# Patient Record
Sex: Female | Born: 1980 | Race: Asian | Hispanic: No | Marital: Married | Smoking: Never smoker
Health system: Southern US, Community
[De-identification: ages and names within clinical notes are randomized; demographics above are authoritative.]

## PROBLEM LIST (undated history)

## (undated) DIAGNOSIS — R12 Heartburn: Secondary | ICD-10-CM

## (undated) DIAGNOSIS — E785 Hyperlipidemia, unspecified: Secondary | ICD-10-CM

## (undated) DIAGNOSIS — O24419 Gestational diabetes mellitus in pregnancy, unspecified control: Secondary | ICD-10-CM

---

## 2006-07-02 HISTORY — PX: DIAGNOSTIC LAPAROSCOPY: SUR761

## 2007-02-12 ENCOUNTER — Encounter (INDEPENDENT_AMBULATORY_CARE_PROVIDER_SITE_OTHER): Payer: Self-pay | Admitting: *Deleted

## 2007-02-12 ENCOUNTER — Ambulatory Visit (HOSPITAL_COMMUNITY): Admission: RE | Admit: 2007-02-12 | Discharge: 2007-02-12 | Payer: Self-pay | Admitting: *Deleted

## 2007-05-18 ENCOUNTER — Inpatient Hospital Stay (HOSPITAL_COMMUNITY): Admission: AD | Admit: 2007-05-18 | Discharge: 2007-05-18 | Payer: Self-pay | Admitting: Obstetrics and Gynecology

## 2007-07-03 DIAGNOSIS — O24419 Gestational diabetes mellitus in pregnancy, unspecified control: Secondary | ICD-10-CM

## 2007-07-03 HISTORY — DX: Gestational diabetes mellitus in pregnancy, unspecified control: O24.419

## 2008-03-30 ENCOUNTER — Encounter: Admission: RE | Admit: 2008-03-30 | Discharge: 2008-03-30 | Payer: Self-pay | Admitting: Obstetrics and Gynecology

## 2008-06-21 ENCOUNTER — Inpatient Hospital Stay (HOSPITAL_COMMUNITY): Admission: AD | Admit: 2008-06-21 | Discharge: 2008-06-25 | Payer: Self-pay | Admitting: Obstetrics and Gynecology

## 2008-06-22 ENCOUNTER — Encounter (INDEPENDENT_AMBULATORY_CARE_PROVIDER_SITE_OTHER): Payer: Self-pay | Admitting: Obstetrics

## 2010-11-14 NOTE — H&P (Signed)
NAME:  Jessica Werner, ARSHAD                 ACCOUNT NO.:  [id number]   MEDICAL RECORD NO.:  [id number]          PATIENT TYPE:  MAT   LOCATION:  MATC                          F[facility]ILITY:  [facility]   PHYSICIAN:  Lenoard Aden, M.D.DATE OF BIRTH:  1981/04/06   DATE OF ADMISSION:  05/18/2007  DATE OF DISCHARGE:                              HISTORY & PHYSICAL   CHIEF COMPLAINT:  Bleeding.   HISTORY OF PRESENT ILLNESS:  This is a 30 year old African-American  female gravida 1, para 0, at 6 weeks LMP with bleeding today.   ALLERGIES:  PROMETRIUM.   SOCIAL HISTORY:  She is a nonsmoker.  She denies domestic or physical  violence.   PAST MEDICAL HISTORY:  She has a history of laparoscopy with removal of  dermoid cyst.  History of ASCUS Pap smear.   FAMILY HISTORY:  Remarkable for heart disease, hypertension, and  diabetes.   PHYSICAL EXAMINATION:  GENERAL:  She is a well-developed, well-  nourished, female in no acute distress.  VITAL SIGNS:  Weight 141 pounds.  HEENT:  Normal.  LUNGS:  Clear.  HEART:  Regular rate and rhythm.  ABDOMEN:  Soft and nontender.  PELVIC:  Cervix is closed.  Uterus slightly enlarged.  No adnexal  masses.  EXTREMITIES:  No cords.  NEUROLOGY:  Nonfocal.   Ultrasound reveals a 5-week intrauterine gestational sac, no fetal pole  seen.  Blood type is pending.   IMPRESSION:  Threatened abortion with intrauterine sac seen.   PLAN:  Follow up in the office as scheduled. Threatened abortion  warnings given.  Check blood type.  RhoGAM pending follow-up.      Lenoard Aden, M.D.  Electronically Signed     RJT/MEDQ  D:  05/18/2007  T:  05/19/2007  Job:  [id number]

## 2010-11-14 NOTE — Op Note (Signed)
NAME:  Jessica Werner, Jessica Werner                 ACCOUNT NO.:  [id number]   MEDICAL RECORD NO.:  [id number]          PATIENT TYPE:  AMB   LOCATION:  SDC                           [facility]   PHYSICIAN:  Poplar B. Earlene Plater, M.D.  DATE OF BIRTH:  [birth year]   DATE OF PROCEDURE:  02/12/2007  DATE OF DISCHARGE:                               OPERATIVE REPORT   PREOPERATIVE DIAGNOSIS:  6 cm complex right ovarian cyst.   POSTOPERATIVE DIAGNOSIS:  Endometrioma and dermoid cyst.   PROCEDURE:  Open laparoscopy and right ovarian cystectomy x2.   SURGEON:  Chester Holstein. Earlene Plater, M.D.   ASSISTANT:  Genia Del, M.D.   ANESTHESIA:  General.   FINDINGS:  4 cm endometrioma, 2 cm dermoid.   SPECIMENS:  Ovarian cyst to pathology.   BLOOD LOSS:  Minimal.   COMPLICATIONS:  None.   INDICATIONS:  Patient with history of complex right ovarian cyst  presents for removal.  Preop CA-125 was normal.  The appearance on  ultrasound was most consistent with endometrioma.  The patient advised  the risks of surgery, infection, bleeding, damage to surrounding organs,  potential need for laparotomy.   PROCEDURE:  Patient taken to the operating room.  General anesthesia  obtained.  She prepped and draped in standard fashion.  Foley catheter  inserted to the bladder.  Hulka tenaculum attached to anterior lip of  the cervix.   10 mm incision placed in the umbilicus and carried sharply to fascia.  Fascia was divided sharply and elevated with Kocher clamps.  Posterior  sheath and perineum were elevated with Allis and entered sharply.  Pursestring suture of 0 Vicryl placed around the fascial defect.  Hassan  cannula inserted and secured.  Pneumoperitoneum with CO2 gas, 11-mm XL  trocar placed in left lower quadrant, and 5 mm in the right each under  direct laparoscopic visualization.   Trendelenburg position obtained.  Bowel mobilized superiorly.  The  appendix and upper abdomen all appeared normal.  Pelvis  inspection  showed a right 6-cm ovarian cyst.  Left ovary, both tubes, uterus,  anterior, posterior cul-de-sac were all normal.  No evidence of  endometriosis in either place in the pelvis.   The right ovarian cyst was grasped, the capsule incised and undermined  and the cyst dissected away bluntly from the ovary.  Just toward the end  of dissection the capsule ruptured draining chocolate fluid consistent  with endometrioma.  The remainder of the cyst was removed.  Just  inferior to this additional cyst was noted.  This was removed intact.  The cysts were placed into the bag and pulled up to the left lower  quadrant port.  The fascia had to be extended slightly to allow the bag  to be removed.  The specimen had to be morcellated in the bag to allow  it to exit the port site.  The lower cyst turned out to be a dermoid  with hair and sebaceous material noted. No leakage into the abdomen.   Pelvis was irrigated, cyst bed was noted to be bleeding slightly.  Given  the large size  of the defect, I decided to close laparoscopically.  This was closed with interrupted figure-of-eight stitches of 0 Vicryl  with hemostasis obtained.   Pelvis irrigated.  Pneumoperitoneum taken down and line of dissection  inspected.  It was hemostatic.  Pneumoperitoneum reobtained and  intercede placed over the right ovary.   The left lower quadrant port was removed.  Pneumoperitoneum taken down.  Fascia closed with a figure-of-eight stitch of 0 Vicryl taking care to  avoid the underlying structures.  Pneumoperitoneum reobtained and the  port was well-approximated, inspected laparoscopically and was  hemostatic.  The right lower quadrant site was removed.  The site was  hemostatic.  The scope was removed.  Gas released.  Hassan cannula  removed.  Pneumoperitoneum released. The umbilical defect closed with  the previously placed pursestring suture after elevating the incision.  No intra-abdominal contents  herniated through prior closure.  Stapes  tissue was closed at the umbilicus and left lower quadrant with 4-0  Vicryl.  Skin was closed at each site with Dermabond.   The patient tolerated the procedure without complications.  She was  taken to recovery room awake, alert, and in stable condition.  All  counts correct per the operating staff.      Gerri Spore B. Earlene Plater, M.D.  Electronically Signed     WBD/MEDQ  D:  02/12/2007  T:  02/12/2007  Job:  [id number]   cc:   Tinnie Gens L. Elesa Hacker, M.D.  Fax: [phone]

## 2010-11-14 NOTE — H&P (Signed)
NAME:  Jessica Werner, Jessica Werner                 ACCOUNT NO.:  [id number]   MEDICAL RECORD NO.:  [id number]          PATIENT TYPE:  INP   LOCATION:  9167                          [facility]ILITY[facility]   PHYSICIAN:  Lenoard Aden, M.D.DATE OF BIRTH:  29-Aug-1980   DATE OF ADMISSION:  06/21/2008  DATE OF DISCHARGE:                              HISTORY & PHYSICAL   CHIEF COMPLAINT:  Induction of labor, gestational diabetes at 37 and 1/2  weeks.   She is a 30 year old [place] female G2, P0 with history of SAB x1 at 8  and 1/2 weeks' gestation with well-controlled gestational diabetes,  questionable LGA for cervical ripening induction.   She has allergies to [facility].   Medications are prenatal vitamins.   She has a personal history of ovarian cyst with nonsurgical  intervention.   She is a nonsmoker, nondrinker.  Denies domestic or physical violence.  History of SAB, complete without D&C.   Family history of myocardial infarction, hypertension, and diabetes.   Prenatal course complicated by gestational diabetes, well controlled on  no medications.   PHYSICAL EXAMINATION:  GENERAL:  On physical exam, she is a well-  developed, well-nourished [place] female in no acute distress.  HEENT:  Normal.  LUNGS:  Clear.  HEART:  Regular rhythm.  ABDOMEN:  Soft, gravid, nontender.  Estimated fetal weight 8 pounds.  GU:  Cervix is closed, 50%, vertex -2.  EXTREMITIES:  There are no cords.  NEUROLOGIC:  Nonfocal.  SKIN:  Intact.   IMPRESSION:  1. 39 and 1/2 weeks' intrauterine pregnancy.  2. Gestational diabetes, well controlled.   PLAN:  Proceed with induction of cervical plates.  NST reactive,  anticipate cautious attempts at vaginal delivery.      Lenoard Aden, M.D.  Electronically Signed     RJT/MEDQ  D:  06/21/2008  T:  06/22/2008  Job:  [id number]

## 2010-11-14 NOTE — Op Note (Signed)
NAME:  Jessica Werner, Jessica Werner                 ACCOUNT NO.:  [id number]   MEDICAL RECORD NO.:  [id number]          PATIENT TYPE:  INP   LOCATION:  9126                          [facility]   PHYSICIAN:  Lenoard Aden, M.D.DATE OF BIRTH:  [birth year]   DATE OF PROCEDURE:  DATE OF DISCHARGE:                               OPERATIVE REPORT   PREOPERATIVE DIAGNOSES:  1. Failure to descend.  2. Nonreassuring fetal heart rate tracing.  3. Gestational diabetes.   POSTOPERATIVE DIAGNOSES:  1. Failure to descend.  2. Nonreassuring fetal heart rate tracing.  3. Gestational diabetes.   PROCEDURE:  Primary low-segment transverse cesarean section.   SURGEON:  Lenoard Aden, MD   ANESTHESIA:  Epidural, Octaviano Glow. Pamalee Leyden, MD.   Bronwen Betters BLOOD LOSS:  1000 mL   COMPLICATIONS:  None.   DRAINS:  Foley.   COUNTS:  Correct.   SPECIMEN:  Placenta to Pathology.   DISPOSITION:  The patient to recovery in good condition.   FINDINGS:  Full-term living female, occiput posterior position, Apgars 5  and 8, cord pH 7.22, terminal meconium noted.  Two-layer uterine closure  performed.  Normal tubes.  Normal ovaries.   BRIEF OPERATIVE NOTE:  After being apprised of risks of anesthesia,  infection, bleeding, injury to abdominal organs, need for repair,  delayed versus immediate complications to include bowel and bladder  injury, the patient was brought to the operating room where she was  administered a dosing of epidural anesthetic without complications.  She  was prepped and draped in usual sterile fashion.  Foley catheter was  previously placed.  After achieving adequate anesthesia, dilute Marcaine  solution was placed.  Pfannenstiel skin incision was made with a  scalpel, carried down to fascia, which was nicked in the midline and  opened transversely using Mayo scissors.  Rectus muscles were dissected  sharply in the midline and peritoneum entered sharply.  Bladder blade  was placed.  Visceral  peritoneum scored sharply off the lower uterine  segment.  Kerr hysterotomy incision was made.  Atraumatic delivery of  full-term living female handed to pediatricians in attendance.  Apgars as  noted.  Cord pH collected.  Placenta delivered manually intact, three-  vessel cord, uterus curetted using a dry lap pack and closed in 2  running imbricating layers of 0 Monocryl suture.  Good hemostasis was  noted.  Interrupted suture placed in the midline for hemostasis.  Bladder flap inspected and found be hemostatic.  Irrigation  accomplished.  Fascia then closed using a 0-Monocryl in continuous  running fashion.  Skin was closed using staples.  Subcutaneous tissue  was reapproximated using 0 plain in a continuous running fashion.  Pressure dressing placed.  The patient tolerated the procedure well and  was transferred to recovery room in good condition.      Lenoard Aden, M.D.  Electronically Signed     RJT/MEDQ  D:  06/22/2008  T:  06/23/2008  Job:  [id number]

## 2010-11-17 NOTE — Discharge Summary (Signed)
NAME:  Jessica Werner, ARCOS                 ACCOUNT NO.:  [id number]   MEDICAL RECORD NO.:  [id number]            PATIENT TYPE:   LOCATION:                                 FACILITY:   PHYSICIAN:  Lenoard Aden, M.D.     DATE OF BIRTH:   DATE OF ADMISSION:  06/21/2008  DATE OF DISCHARGE:  06/25/2008                               DISCHARGE SUMMARY   HOSPITAL COURSE:  The patient underwent an uncomplicated primary low  segment transverse Cesarean section on June 22, 2008.  Her  postoperative course was uncomplicated.  She tolerated her diet well.  Discharge teaching done.  Discharge medications to include Tylox,  prenatal vitamins and iron.  She is to follow up in the office in 4 to 6  weeks.      Lenoard Aden, M.D.  Electronically Signed     RJT/MEDQ  D:  08/24/2008  T:  08/24/2008  Job:  [id number]

## 2011-04-06 LAB — CBC
HCT: 37.5 % (ref 36.0–46.0)
HCT: 37.9 % (ref 36.0–46.0)
HCT: 41.5 % (ref 36.0–46.0)
Hemoglobin: 11.5 g/dL — ABNORMAL LOW (ref 12.0–15.0)
Hemoglobin: 13.1 g/dL (ref 12.0–15.0)
Hemoglobin: 14.5 g/dL (ref 12.0–15.0)
MCHC: 34.9 g/dL (ref 30.0–36.0)
MCV: 89.5 fL (ref 78.0–100.0)
Platelets: 80 10*3/uL — ABNORMAL LOW (ref 150–400)
RBC: 4.2 MIL/uL (ref 3.87–5.11)
RBC: 4.24 MIL/uL (ref 3.87–5.11)
RDW: 14.7 % (ref 11.5–15.5)
RDW: 14.7 % (ref 11.5–15.5)
RDW: 15.2 % (ref 11.5–15.5)
WBC: 10.8 10*3/uL — ABNORMAL HIGH (ref 4.0–10.5)
WBC: 13.6 10*3/uL — ABNORMAL HIGH (ref 4.0–10.5)
WBC: 9.1 10*3/uL (ref 4.0–10.5)

## 2011-04-06 LAB — GLUCOSE, CAPILLARY
Glucose-Capillary: 73 mg/dL (ref 70–99)
Glucose-Capillary: 76 mg/dL (ref 70–99)

## 2011-04-06 LAB — COMPREHENSIVE METABOLIC PANEL
ALT: 14 U/L (ref 0–35)
Alkaline Phosphatase: 234 U/L — ABNORMAL HIGH (ref 39–117)
BUN: 4 mg/dL — ABNORMAL LOW (ref 6–23)
Calcium: 9.3 mg/dL (ref 8.4–10.5)
Total Bilirubin: 0.5 mg/dL (ref 0.3–1.2)

## 2011-04-10 LAB — ABO/RH: ABO/RH(D): B POS

## 2011-04-16 LAB — DIFFERENTIAL
Basophils Absolute: 0
Basophils Relative: 1
Eosinophils Absolute: 0.1
Eosinophils Relative: 1
Lymphs Abs: 3
Monocytes Relative: 5
Neutrophils Relative %: 62

## 2011-04-16 LAB — CBC
RDW: 13.1
WBC: 9.6

## 2011-04-16 LAB — PREGNANCY, URINE: Preg Test, Ur: NEGATIVE

## 2013-04-17 ENCOUNTER — Ambulatory Visit: Payer: BC Managed Care – PPO

## 2013-04-17 ENCOUNTER — Ambulatory Visit: Payer: BC Managed Care – PPO | Admitting: Emergency Medicine

## 2013-04-17 VITALS — BP 110/60 | HR 77 | Temp 97.8°F | Resp 16 | Ht <= 58 in | Wt 138.0 lb

## 2013-04-17 DIAGNOSIS — M542 Cervicalgia: Secondary | ICD-10-CM

## 2013-04-17 DIAGNOSIS — M549 Dorsalgia, unspecified: Secondary | ICD-10-CM

## 2013-04-17 MED ORDER — CYCLOBENZAPRINE HCL 5 MG PO TABS: ORAL_TABLET | ORAL | Status: DC

## 2013-04-17 MED ORDER — MELOXICAM 7.5 MG PO TABS: ORAL_TABLET | ORAL | Status: DC

## 2013-04-17 NOTE — Progress Notes (Signed)
This chart was scribed for Earl Lites MD by Joaquin Music, ED Scribe. This patient was seen in room Room 2 and the patient's care was started at 12:22 PM  Subjective:    Patient ID: Jessica Werner, female    DOB: 29-Nov-1980, 32 y.o.   MRN: [id number]  HPI Jessica Werner is a 32 y.o. female who presents to the [facility] complaining of ongoing, worsening upper back pain for over 2 weeks. She states the pain has been "coming and going." Pt states the pain is below her neck. Pt made modifications with her pillows in order to get sleep. She has recently started her [employer] camp, which she has been doing prior to pain being present. Pt denies numbness in extremities. She has taken OTC Ibuprofen. Pt has applied heat and cold pack to the area. Pt is a Surveyor, minerals. Pt denies pregnancy. Pt states she has irregular menstrual cycles.   Pt is originally from [place].    Review of Systems     Objective:   Physical Exam  Constitutional: She appears well-developed and well-nourished.  Musculoskeletal:  Tender over C4-C5 processes. DTR 2+ except for L Triceps which was absent due to previous injury. Motor strength 5/5.  Psychiatric: She has a normal mood and affect. Her behavior is normal.   [facility] reading (PRIMARY) by  Dr Cleta Alberts she has C6-C7 degenerative disc disease. Chest x-ray is normal.       Assessment & Plan:  We'll treat with anti-inflammatory muscle relaxant and cut back on her exercise program.

## 2013-04-17 NOTE — Patient Instructions (Signed)

## 2013-05-07 ENCOUNTER — Other Ambulatory Visit: Payer: Self-pay

## 2014-06-18 ENCOUNTER — Other Ambulatory Visit: Payer: Self-pay | Admitting: Family Medicine

## 2014-06-18 DIAGNOSIS — R1904 Left lower quadrant abdominal swelling, mass and lump: Secondary | ICD-10-CM

## 2014-06-22 ENCOUNTER — Ambulatory Visit (INDEPENDENT_AMBULATORY_CARE_PROVIDER_SITE_OTHER): Payer: BC Managed Care – PPO

## 2014-06-22 DIAGNOSIS — R1904 Left lower quadrant abdominal swelling, mass and lump: Secondary | ICD-10-CM

## 2014-08-05 ENCOUNTER — Other Ambulatory Visit: Payer: Self-pay | Admitting: Obstetrics and Gynecology

## 2014-08-12 ENCOUNTER — Encounter (HOSPITAL_COMMUNITY)
Admission: RE | Admit: 2014-08-12 | Discharge: 2014-08-12 | Disposition: A | Discharge status: Home / Self Care | Payer: BLUE CROSS/BLUE SHIELD | Source: Ambulatory Visit | Attending: Obstetrics and Gynecology | Admitting: Obstetrics and Gynecology

## 2014-08-12 ENCOUNTER — Encounter (HOSPITAL_COMMUNITY): Payer: Self-pay

## 2014-08-12 DIAGNOSIS — Z01818 Encounter for other preprocedural examination: Secondary | ICD-10-CM | POA: Insufficient documentation

## 2014-08-12 DIAGNOSIS — D369 Benign neoplasm, unspecified site: Secondary | ICD-10-CM | POA: Insufficient documentation

## 2014-08-12 HISTORY — DX: Heartburn: R12

## 2014-08-12 HISTORY — DX: Hyperlipidemia, unspecified: E78.5

## 2014-08-12 LAB — CBC
HCT: 38.6 % (ref 36.0–46.0)
HEMOGLOBIN: 13.2 g/dL (ref 12.0–15.0)
MCH: 28.9 pg (ref 26.0–34.0)
MCHC: 34.2 g/dL (ref 30.0–36.0)
MCV: 84.6 fL (ref 78.0–100.0)
Platelets: 224 10*3/uL (ref 150–400)
RBC: 4.56 MIL/uL (ref 3.87–5.11)
RDW: 12.6 % (ref 11.5–15.5)
WBC: 7.7 10*3/uL (ref 4.0–10.5)

## 2014-08-12 NOTE — Patient Instructions (Addendum)
   Your procedure is scheduled on: Thursday, Feb 18  Enter through the [employer] [facility] at: 8:15 AM Pick up the phone at the desk and dial [phone] and inform us of your arrival.  Please call this number if you have any problems the morning of surgery: [phone]  Remember: Do not eat or drink after midnight: Wednesday Take these medicines the morning of surgery with a SIP OF WATER: NONE  Do not wear jewelry, make-up, or FINGER nail polish No metal in your hair or on your body. Do not wear lotions, powders, perfumes.  You may wear deodorant.  Do not bring valuables to the hospital. Contacts, dentures or bridgework may not be worn into surgery.    Patients discharged on the day of surgery will not be allowed to drive home.  Home with husband Edrick Kins  cell [phone]

## 2014-08-18 NOTE — H&P (Signed)
NAME:  Jessica Werner, RUNKEL                 ACCOUNT NO.:  [id number]  MEDICAL RECORD NO.:  [id number]  LOCATION:  PERIO                         FACILITY:  [place]  PHYSICIAN:  Lovenia Kim, M.D.DATE OF BIRTH:  1981/01/22  DATE OF ADMISSION:  07/19/2014 DATE OF DISCHARGE:                             HISTORY & PHYSICAL   PREOPERATIVE DIAGNOSIS:  Complex left ovarian mass consistent with dermoid with negative tumor markers.  HISTORY OF PRESENT ILLNESS:  The patient is a 35 year old female G2, P1 who presents for surgical evaluation of left ovarian cyst with left lower quadrant pain noted.  ALLERGIES:  She has allergies to [facility].  MEDICATIONS:  Fenofibrate.  FAMILY HISTORY:  Heart disease, diabetes, and chronic hypertension.  PAST MEDICAL HISTORY:  She has a history of a miscarriage x1 and C- section x1.  Previous laparoscopy in 2008 with removal of a right ovarian cyst consistent with a dermoid and an endometrioma at that time and uncomplicated at that time.  PHYSICAL EXAMINATION:  GENERAL:  She is a well-developed, well-nourished female, in no acute distress. HEENT:  Normal. NECK:  Supple.  Full range of motion. LUNGS:  Clear. HEART:  Regular rate and rhythm. ABDOMEN:  Soft, nontender.  Pelvic exam reveals left adnexal tenderness and fullness. EXTREMITIES:  There are no cords. NEUROLOGIC:  Nonfocal. SKIN:  Intact.  IMPRESSION:  Left lower quadrant pain with complex left ovarian cyst most consistent with left dermoid cyst.  Tumor markers negative.  PLAN:  Proceed with da Vinci assisted left ovarian cystectomy, possible left S and O.  Risks of anesthesia, infection, bleeding, injury to surrounding organs with possible need for repair was discussed, delayed versus immediate complications to include bowel and bladder injury noted.     Lovenia Kim, M.D.     RJT/MEDQ  D:  08/18/2014  T:  08/18/2014  Job:  [id number]  cc:   [facility]

## 2014-08-18 NOTE — Anesthesia Preprocedure Evaluation (Signed)
Anesthesia Evaluation  Patient identified by MRN, date of birth, ID band Patient awake    Reviewed: Allergy & Precautions, NPO status , Patient's Chart, lab work & pertinent test results  History of Anesthesia Complications Negative for: history of anesthetic complications  Airway Mallampati: II  TM Distance: >3 FB Neck ROM: Full    Dental no notable dental hx. (+) Dental Advisory Given   Pulmonary neg pulmonary ROS,  breath sounds clear to auscultation  Pulmonary exam normal       Cardiovascular negative cardio ROS  Rhythm:Regular Rate:Normal     Neuro/Psych negative neurological ROS  negative psych ROS   GI/Hepatic Neg liver ROS, GERD-  Medicated and Controlled,  Endo/Other  negative endocrine ROS  Renal/GU negative Renal ROS  negative genitourinary   Musculoskeletal negative musculoskeletal ROS (+)   Abdominal   Peds negative pediatric ROS (+)  Hematology negative hematology ROS (+)   Anesthesia Other Findings   Reproductive/Obstetrics negative OB ROS                             Anesthesia Physical Anesthesia Plan  ASA: II  Anesthesia Plan: General   Post-op Pain Management:    Induction: Intravenous  Airway Management Planned: Oral ETT  Additional Equipment:   Intra-op Plan:   Post-operative Plan: Extubation in OR  Informed Consent: I have reviewed the patients History and Physical, chart, labs and discussed the procedure including the risks, benefits and alternatives for the proposed anesthesia with the patient or authorized representative who has indicated his/her understanding and acceptance.   Dental advisory given  Plan Discussed with: CRNA  Anesthesia Plan Comments:         Anesthesia Quick Evaluation

## 2014-08-19 ENCOUNTER — Ambulatory Visit (HOSPITAL_COMMUNITY): Payer: BLUE CROSS/BLUE SHIELD | Admitting: Anesthesiology

## 2014-08-19 ENCOUNTER — Encounter (HOSPITAL_COMMUNITY): Payer: Self-pay | Admitting: Certified Registered Nurse Anesthetist

## 2014-08-19 ENCOUNTER — Encounter (HOSPITAL_COMMUNITY)
Admission: RE | Disposition: A | Discharge status: Home / Self Care | Payer: Self-pay | Source: Ambulatory Visit | Attending: Obstetrics and Gynecology

## 2014-08-19 ENCOUNTER — Ambulatory Visit (HOSPITAL_COMMUNITY)
Admission: RE | Admit: 2014-08-19 | Discharge: 2014-08-19 | Disposition: A | Discharge status: Home / Self Care | Payer: BLUE CROSS/BLUE SHIELD | Source: Ambulatory Visit | Attending: Obstetrics and Gynecology | Admitting: Obstetrics and Gynecology

## 2014-08-19 DIAGNOSIS — N8320 Unspecified ovarian cysts: Secondary | ICD-10-CM | POA: Diagnosis not present

## 2014-08-19 DIAGNOSIS — Z888 Allergy status to other drugs, medicaments and biological substances status: Secondary | ICD-10-CM | POA: Insufficient documentation

## 2014-08-19 DIAGNOSIS — N736 Female pelvic peritoneal adhesions (postinfective): Secondary | ICD-10-CM | POA: Insufficient documentation

## 2014-08-19 DIAGNOSIS — N801 Endometriosis of ovary: Secondary | ICD-10-CM | POA: Diagnosis not present

## 2014-08-19 HISTORY — PX: ROBOTIC ASSISTED LAPAROSCOPIC OVARIAN CYSTECTOMY: SHX6081

## 2014-08-19 LAB — HCG, SERUM, QUALITATIVE: Preg, Serum: NEGATIVE

## 2014-08-19 SURGERY — ROBOTIC ASSISTED LAPAROSCOPIC OVARIAN CYSTECTOMY
Anesthesia: General | Site: Abdomen | Laterality: Left

## 2014-08-19 MED ORDER — PROPOFOL 10 MG/ML IV BOLUS
INTRAVENOUS | Status: AC
  Filled 2014-08-19: qty 20

## 2014-08-19 MED ORDER — ARTIFICIAL TEARS OP OINT
TOPICAL_OINTMENT | OPHTHALMIC | Status: AC
  Filled 2014-08-19: qty 3.5

## 2014-08-19 MED ORDER — HYDROMORPHONE HCL 1 MG/ML IJ SOLN
INTRAMUSCULAR | Status: AC
  Filled 2014-08-19: qty 1

## 2014-08-19 MED ORDER — OXYCODONE-ACETAMINOPHEN 5-325 MG PO TABS
ORAL_TABLET | ORAL | Status: AC
  Administered 2014-08-19: 1 via ORAL
  Filled 2014-08-19: qty 1

## 2014-08-19 MED ORDER — NEOSTIGMINE METHYLSULFATE 10 MG/10ML IV SOLN
INTRAVENOUS | Status: DC | PRN
  Administered 2014-08-19: 3 mg via INTRAVENOUS

## 2014-08-19 MED ORDER — FENTANYL CITRATE 0.05 MG/ML IJ SOLN
INTRAMUSCULAR | Status: DC | PRN
  Administered 2014-08-19 (×4): 50 ug via INTRAVENOUS

## 2014-08-19 MED ORDER — SCOPOLAMINE 1 MG/3DAYS TD PT72
MEDICATED_PATCH | TRANSDERMAL | Status: AC
  Administered 2014-08-19: 1.5 mg via TRANSDERMAL
  Filled 2014-08-19: qty 1

## 2014-08-19 MED ORDER — FENTANYL CITRATE 0.05 MG/ML IJ SOLN
INTRAMUSCULAR | Status: AC
  Filled 2014-08-19: qty 5

## 2014-08-19 MED ORDER — PROPOFOL 10 MG/ML IV BOLUS
INTRAVENOUS | Status: DC | PRN
  Administered 2014-08-19: 200 mg via INTRAVENOUS

## 2014-08-19 MED ORDER — DEXAMETHASONE SODIUM PHOSPHATE 10 MG/ML IJ SOLN
INTRAMUSCULAR | Status: DC | PRN
  Administered 2014-08-19: 4 mg via INTRAVENOUS

## 2014-08-19 MED ORDER — ROPIVACAINE HCL 5 MG/ML IJ SOLN
INTRAMUSCULAR | Status: AC
  Filled 2014-08-19: qty 30

## 2014-08-19 MED ORDER — CEFAZOLIN SODIUM-DEXTROSE 2-3 GM-% IV SOLR
2.0000 g | INTRAVENOUS | Status: AC
  Administered 2014-08-19: 2 g via INTRAVENOUS

## 2014-08-19 MED ORDER — ROCURONIUM BROMIDE 100 MG/10ML IV SOLN
INTRAVENOUS | Status: DC | PRN
  Administered 2014-08-19: 40 mg via INTRAVENOUS
  Administered 2014-08-19: 10 mg via INTRAVENOUS

## 2014-08-19 MED ORDER — KETOROLAC TROMETHAMINE 30 MG/ML IJ SOLN
INTRAMUSCULAR | Status: AC
Start: 2014-08-19 — End: 2014-08-19
  Filled 2014-08-19: qty 1

## 2014-08-19 MED ORDER — MIDAZOLAM HCL 2 MG/2ML IJ SOLN
INTRAMUSCULAR | Status: AC
Start: 2014-08-19 — End: 2014-08-19
  Filled 2014-08-19: qty 2

## 2014-08-19 MED ORDER — GLYCOPYRROLATE 0.2 MG/ML IJ SOLN
INTRAMUSCULAR | Status: AC
  Filled 2014-08-19: qty 3

## 2014-08-19 MED ORDER — BUPIVACAINE HCL (PF) 0.25 % IJ SOLN
INTRAMUSCULAR | Status: AC
  Filled 2014-08-19: qty 30

## 2014-08-19 MED ORDER — SCOPOLAMINE 1 MG/3DAYS TD PT72
1.0000 | MEDICATED_PATCH | Freq: Once | TRANSDERMAL | Status: DC
  Administered 2014-08-19: 1.5 mg via TRANSDERMAL

## 2014-08-19 MED ORDER — DEXAMETHASONE SODIUM PHOSPHATE 4 MG/ML IJ SOLN
INTRAMUSCULAR | Status: AC
  Filled 2014-08-19: qty 1

## 2014-08-19 MED ORDER — ONDANSETRON HCL 4 MG/2ML IJ SOLN: 4.0000 mg | Freq: Once | INTRAMUSCULAR | Status: DC | PRN

## 2014-08-19 MED ORDER — ONDANSETRON HCL 4 MG/2ML IJ SOLN
INTRAMUSCULAR | Status: AC
  Filled 2014-08-19: qty 2

## 2014-08-19 MED ORDER — BUPIVACAINE HCL (PF) 0.25 % IJ SOLN
INTRAMUSCULAR | Status: AC
  Filled 2014-08-19: qty 20

## 2014-08-19 MED ORDER — SODIUM CHLORIDE 0.9 % IJ SOLN
INTRAMUSCULAR | Status: DC | PRN
  Administered 2014-08-19: 20 mL via INTRAVENOUS

## 2014-08-19 MED ORDER — LACTATED RINGERS IR SOLN
Status: DC | PRN
  Administered 2014-08-19: 3000 mL

## 2014-08-19 MED ORDER — BUPIVACAINE LIPOSOME 1.3 % IJ SUSP
20.0000 mL | Freq: Once | INTRAMUSCULAR | Status: AC
  Administered 2014-08-19: 20 mL
  Filled 2014-08-19: qty 20

## 2014-08-19 MED ORDER — SODIUM CHLORIDE 0.9 % IJ SOLN
INTRAMUSCULAR | Status: AC
  Filled 2014-08-19: qty 50

## 2014-08-19 MED ORDER — LIDOCAINE HCL (CARDIAC) 20 MG/ML IV SOLN
INTRAVENOUS | Status: DC | PRN
  Administered 2014-08-19: 30 mg via INTRAVENOUS

## 2014-08-19 MED ORDER — MIDAZOLAM HCL 2 MG/2ML IJ SOLN
INTRAMUSCULAR | Status: DC | PRN
  Administered 2014-08-19: 2 mg via INTRAVENOUS

## 2014-08-19 MED ORDER — OXYCODONE-ACETAMINOPHEN 5-325 MG PO TABS
1.0000 | ORAL_TABLET | ORAL | Status: DC | PRN
  Administered 2014-08-19: 1 via ORAL

## 2014-08-19 MED ORDER — FENTANYL CITRATE 0.05 MG/ML IJ SOLN: 25.0000 ug | INTRAMUSCULAR | Status: DC | PRN

## 2014-08-19 MED ORDER — ONDANSETRON HCL 4 MG/2ML IJ SOLN: INTRAMUSCULAR | Status: DC | PRN

## 2014-08-19 MED ORDER — SODIUM CHLORIDE 0.9 % IJ SOLN
INTRAMUSCULAR | Status: AC
  Filled 2014-08-19: qty 10

## 2014-08-19 MED ORDER — CEFAZOLIN SODIUM-DEXTROSE 2-3 GM-% IV SOLR
INTRAVENOUS | Status: AC
  Filled 2014-08-19: qty 50

## 2014-08-19 MED ORDER — ONDANSETRON HCL 4 MG/2ML IJ SOLN
INTRAMUSCULAR | Status: DC | PRN
  Administered 2014-08-19: 4 mg via INTRAVENOUS

## 2014-08-19 MED ORDER — KETOROLAC TROMETHAMINE 30 MG/ML IJ SOLN
INTRAMUSCULAR | Status: DC | PRN
  Administered 2014-08-19: 30 mg via INTRAVENOUS

## 2014-08-19 MED ORDER — NEOSTIGMINE METHYLSULFATE 10 MG/10ML IV SOLN
INTRAVENOUS | Status: AC
  Filled 2014-08-19: qty 1

## 2014-08-19 MED ORDER — LACTATED RINGERS IV SOLN
INTRAVENOUS | Status: DC
  Administered 2014-08-19: 09:00:00 via INTRAVENOUS

## 2014-08-19 MED ORDER — LIDOCAINE HCL (PF) 1 % IJ SOLN
INTRAMUSCULAR | Status: AC
  Filled 2014-08-19: qty 5

## 2014-08-19 MED ORDER — GLYCOPYRROLATE 0.2 MG/ML IJ SOLN
INTRAMUSCULAR | Status: DC | PRN
  Administered 2014-08-19: 0.4 mg via INTRAVENOUS

## 2014-08-19 MED ORDER — OXYCODONE-ACETAMINOPHEN 5-325 MG PO TABS: 1.0000 | ORAL_TABLET | ORAL | Status: DC | PRN

## 2014-08-19 SURGICAL SUPPLY — 60 items
BARRIER ADHS 3X4 INTERCEED (GAUZE/BANDAGES/DRESSINGS) ×3 IMPLANT
CLOTH BEACON ORANGE TIMEOUT ST (SAFETY) ×3 IMPLANT
CONT PATH 16OZ SNAP LID 3702 (MISCELLANEOUS) ×3 IMPLANT
COVER BACK TABLE 60X90IN (DRAPES) ×6 IMPLANT
COVER TIP SHEARS 8 DVNC (MISCELLANEOUS) ×2 IMPLANT
COVER TIP SHEARS 8MM DA VINCI (MISCELLANEOUS) ×4
DECANTER SPIKE VIAL GLASS SM (MISCELLANEOUS) ×3 IMPLANT
DRAPE WARM FLUID 44X44 (DRAPE) ×3 IMPLANT
DRSG COVADERM PLUS 2X2 (GAUZE/BANDAGES/DRESSINGS) ×12 IMPLANT
DRSG OPSITE POSTOP 3X4 (GAUZE/BANDAGES/DRESSINGS) ×3 IMPLANT
ELECT REM PT RETURN 9FT ADLT (ELECTROSURGICAL) ×3
ELECTRODE REM PT RTRN 9FT ADLT (ELECTROSURGICAL) ×1 IMPLANT
GAUZE VASELINE 3X9 (GAUZE/BANDAGES/DRESSINGS) IMPLANT
GLOVE BIO SURGEON STRL SZ7.5 (GLOVE) ×3 IMPLANT
GYRUS RUMI II 2.5CM BLUE (DISPOSABLE)
GYRUS RUMI II 3.5CM BLUE (DISPOSABLE)
GYRUS RUMI II 4.0CM BLUE (DISPOSABLE)
KIT ACCESSORY DA VINCI DISP (KITS) ×2
KIT ACCESSORY DVNC DISP (KITS) ×1 IMPLANT
LIQUID BAND (GAUZE/BANDAGES/DRESSINGS) ×3 IMPLANT
NEEDLE HYPO 22GX1.5 SAFETY (NEEDLE) ×3 IMPLANT
NEEDLE INSUFFLATION 150MM (ENDOMECHANICALS) ×3 IMPLANT
NS IRRIG 1000ML POUR BTL (IV SOLUTION) ×9 IMPLANT
OCCLUDER COLPOPNEUMO (BALLOONS) IMPLANT
PACK ROBOT WH (CUSTOM PROCEDURE TRAY) ×3 IMPLANT
PACK ROBOTIC GOWN (GOWN DISPOSABLE) ×3 IMPLANT
PAD POSITIONER PINK NONSTERILE (MISCELLANEOUS) ×3 IMPLANT
PAD PREP 24X48 CUFFED NSTRL (MISCELLANEOUS) ×6 IMPLANT
RUMI II 3.0CM BLUE KOH-EFFICIE (DISPOSABLE) IMPLANT
RUMI II GYRUS 2.5CM BLUE (DISPOSABLE) IMPLANT
RUMI II GYRUS 3.5CM BLUE (DISPOSABLE) IMPLANT
RUMI II GYRUS 4.0CM BLUE (DISPOSABLE) IMPLANT
SET CYSTO W/LG BORE CLAMP LF (SET/KITS/TRAYS/PACK) IMPLANT
SET IRRIG TUBING LAPAROSCOPIC (IRRIGATION / IRRIGATOR) ×3 IMPLANT
SET TRI-LUMEN FLTR TB AIRSEAL (TUBING) IMPLANT
SUT VIC AB 0 CT1 27 (SUTURE) ×2
SUT VIC AB 0 CT1 27XBRD ANBCTR (SUTURE) ×1 IMPLANT
SUT VIC AB 3-0 SH 27 (SUTURE) ×4
SUT VIC AB 3-0 SH 27X BRD (SUTURE) ×2 IMPLANT
SUT VICRYL 0 UR6 27IN ABS (SUTURE) ×6 IMPLANT
SUT VICRYL 4-0 PS2 18IN ABS (SUTURE) ×3 IMPLANT
SUT VICRYL RAPIDE 4/0 PS 2 (SUTURE) IMPLANT
SUT VLOC 180 3-0 9IN GS21 (SUTURE) IMPLANT
SYR 50ML LL SCALE MARK (SYRINGE) ×6 IMPLANT
SYR CONTROL 10ML LL (SYRINGE) ×3 IMPLANT
SYRINGE 10CC LL (SYRINGE) ×3 IMPLANT
SYSTEM CONVERTIBLE TROCAR (TROCAR) IMPLANT
TIP UTERINE 5.1X6CM LAV DISP (MISCELLANEOUS) IMPLANT
TIP UTERINE 6.7X10CM GRN DISP (MISCELLANEOUS) ×3 IMPLANT
TIP UTERINE 6.7X6CM WHT DISP (MISCELLANEOUS) IMPLANT
TIP UTERINE 6.7X8CM BLUE DISP (MISCELLANEOUS) IMPLANT
TOWEL OR 17X24 6PK STRL BLUE (TOWEL DISPOSABLE) ×9 IMPLANT
TRAY FOLEY BAG SILVER LF 16FR (CATHETERS) ×3 IMPLANT
TROCAR DISP BLADELESS 8 DVNC (TROCAR) ×1 IMPLANT
TROCAR DISP BLADELESS 8MM (TROCAR) ×2
TROCAR PORT AIRSEAL 5X120 (TROCAR) IMPLANT
TROCAR XCEL 12X100 BLDLESS (ENDOMECHANICALS) IMPLANT
TROCAR XCEL NON-BLD 5MMX100MML (ENDOMECHANICALS) ×3 IMPLANT
TROCAR Z-THREAD 12X150 (TROCAR) ×3 IMPLANT
WARMER LAPAROSCOPE (MISCELLANEOUS) ×3 IMPLANT

## 2014-08-19 NOTE — Transfer of Care (Signed)
Immediate Anesthesia Transfer of Care Note  Patient: Jessica Werner  Procedure(s) Performed: Procedure(s): ROBOTIC ASSISTED LAPAROSCOPIC OVARIAN CYSTECTOMY (Left)  Patient Location: PACU  Anesthesia Type:General  Level of Consciousness: awake and sedated  Airway & Oxygen Therapy: Patient Spontanous Breathing and Patient connected to nasal cannula oxygen  Post-op Assessment: Report given to RN and Post -op Vital signs reviewed and stable  Post vital signs: Reviewed and stable  Last Vitals:  Filed Vitals:   08/19/14 0825  BP: 118/73  Pulse: 79  Temp: 36.9 C  Resp: 18    Complications: No apparent anesthesia complications

## 2014-08-19 NOTE — Discharge Instructions (Signed)

## 2014-08-19 NOTE — Anesthesia Postprocedure Evaluation (Signed)
  Anesthesia Post-op Note  Patient: Jessica Werner  Procedure(s) Performed: Procedure(s): ROBOTIC ASSISTED LAPAROSCOPIC OVARIAN CYSTECTOMY (Left)  Patient Location: PACU  Anesthesia Type:General  Level of Consciousness: awake, alert  and oriented  Airway and Oxygen Therapy: Patient Spontanous Breathing  Post-op Pain: mild  Post-op Assessment: Post-op Vital signs reviewed, Patient's Cardiovascular Status Stable, Respiratory Function Stable, Patent Airway, No signs of Nausea or vomiting and Pain level controlled  Post-op Vital Signs: Reviewed and stable  Last Vitals:  Filed Vitals:   08/19/14 1345  BP: 114/87  Pulse: 72  Temp:   Resp: 17    Complications: No apparent anesthesia complications

## 2014-08-19 NOTE — Op Note (Signed)
08/19/2014  12:37 PM  PATIENT:  Jessica Werner  34 y.o. female  PRE-OPERATIVE DIAGNOSIS:  Dermoid Cyst  POST-OPERATIVE DIAGNOSIS:  Dermoid Cyst of left ovary Right ovarian endometriosis Omental adhesions Left ovarian simple cyst  PROCEDURE:  Procedure(s): ROBOTIC ASSISTED LAPAROSCOPIC OVARIAN CYSTECTOMY LYSIS OF ADHESIONS ABLATION AND EXCISION OF LEFT OVARIAN ENDOMETRIOSIS LEFT OVARIAN CYSTOTOMY  SURGEON:  Surgeon(s): Lovenia Kim, MD  ASSISTANTS: Drake Leach, cNM   ANESTHESIA:   local and general  ESTIMATED BLOOD LOSS: MINIMAL   DRAINS: Urinary Catheter (Foley)   LOCAL MEDICATIONS USED:  MARCAINE    and Amount: 40 ml  SPECIMEN:  Source of Specimen:  LEFT OVARIAN CYST CAPSULE AND  CONTENTS  DISPOSITION OF SPECIMEN:  PATHOLOGY  COUNTS:  YES  DICTATION #: [id number]  PLAN OF CARE: DC HOME  PATIENT DISPOSITION:  PACU - hemodynamically stable.

## 2014-08-19 NOTE — Progress Notes (Signed)
Patient ID: Jessica Werner, female   DOB: [birth year], 34 y.o.   MRN: [id number] Patient seen and examined. Consent witnessed and signed. No changes noted. Update completed.

## 2014-08-20 ENCOUNTER — Encounter (HOSPITAL_COMMUNITY): Payer: Self-pay | Admitting: Obstetrics and Gynecology

## 2014-08-20 NOTE — Op Note (Signed)
NAME:  Jessica Werner, Jessica Werner                 ACCOUNT NO.:  [id number]  MEDICAL RECORD NO.:  [id number]  LOCATION:                                 FACILITY:  PHYSICIAN:  Lovenia Kim, M.D.DATE OF BIRTH:  [birth year]  DATE OF PROCEDURE:  08/19/2014 DATE OF DISCHARGE:                              OPERATIVE REPORT   PREOPERATIVE DIAGNOSIS:  Left lower quadrant pain and left complex ovarian mass consistent with dermoid cyst.  POSTOPERATIVE DIAGNOSES: 1. Left lower quadrant pain and left complex ovarian mass consistent     with dermoid cyst. 2. Right ovarian endometriosis, simple left ovarian cyst, and pelvic     adhesions.  PROCEDURE:  Da Vinci assisted left ovarian cystectomy, ablation and excision of right ovarian endometriosis, lysis of omental adhesions in the left lower and right lower quadrant, and left ovarian cystotomy.  SURGEON:  Lovenia Kim, M.D.  Terrence DupontDrake Leach.  ESTIMATED BLOOD LOSS:  Less than 50 mL.  COMPLICATIONS:  None.  DRAINS:  Foley.  COUNTS:  Correct.  DISPOSITION:  The patient went to recovery room in good condition.  BRIEF OPERATIVE NOTE:  After being apprised of risks of anesthesia, infection, bleeding, injury to surrounding organs, possible need for repair, delayed versus immediate complications to include bowel and bladder injury, possible need for repair, the patient was brought to the operating room, where she was administered general anesthetic without complications.  Prepped and draped in usual sterile fashion.  Foley catheter was placed.  A RUMI retractor was placed per vagina. Infraumbilical incision was made with a scalpel.  Veress needle placed. Opening pressure -1, 3 L CO2 insufflated without difficulty.  Trocar was placed atraumatically.  Visualization reveals as noted,  right ovarian endometriosis, large left ovarian cyst, which appears to be a dual cyst with a simple component, omental adhesions in the left lower quadrant and the  right lower quadrant.  At this time, the 2 auxiliary ports were placed on the left and an assistant port and an 8 mm port and one 8 mm port on the right.  Through the 8 mm port on the right, the camera is entered to lyse sharply with EndoShears, the omental adhesions from the anterior abdominal wall, so that the pelvis can  be better visualized.  At this time, robot was then docked in a standard fashion placing the EndoShears and PK forceps.  A linear incision is made over the cystic mass from the left ovary, and the ovarian capsule was seen and the cyst was then excised intact and placed in the cul-de-sac.  Good hemostasis was noted.  The left ovarian cystotomy was performed for aspiration of clear fluid.  On the right ovary, there is monopolar ablation and excision of right ovarian endometriosis without difficulty.  Both tubes appear normal.  Anterior cul-de-sac with some minor adhesive disease from previous C-section, posterior cul-de-sac is clear.  At this time, the robot was undocked and the 8 mm port was placed.  The dermoid cyst or ovarian mass was placed into an EndoCatch and brought up through the abdominal incision, where it is ruptured and enucleated without spilling any contents into the abdomen.  At this  time, good hemostasis was noted. Revisualization reveals a hemostatic left ovary, which was then wrapped in Interceed.  After achieving good hemostasis, all instruments were removed under direct visualization.  CO2 was released per the AirSeal. The incisions were closed using 0 Vicryl, 4-0 Vicryl, and Dermabond. The RUMI was removed from the vagina.  The patient tolerated the procedure well, was awakened, and transferred to recovery in good condition.     Lovenia Kim, M.D.     RJT/MEDQ  D:  08/19/2014  T:  08/20/2014  Job:  [id number]

## 2014-12-11 ENCOUNTER — Emergency Department (HOSPITAL_COMMUNITY)
Admission: EM | Admit: 2014-12-11 | Discharge: 2014-12-12 | Disposition: A | Discharge status: Home / Self Care | Payer: BLUE CROSS/BLUE SHIELD | Attending: Emergency Medicine | Admitting: Emergency Medicine

## 2014-12-11 ENCOUNTER — Encounter (HOSPITAL_COMMUNITY): Payer: Self-pay | Admitting: Emergency Medicine

## 2014-12-11 DIAGNOSIS — Z3202 Encounter for pregnancy test, result negative: Secondary | ICD-10-CM | POA: Diagnosis not present

## 2014-12-11 DIAGNOSIS — Z8639 Personal history of other endocrine, nutritional and metabolic disease: Secondary | ICD-10-CM | POA: Diagnosis not present

## 2014-12-11 DIAGNOSIS — R55 Syncope and collapse: Secondary | ICD-10-CM | POA: Insufficient documentation

## 2014-12-11 NOTE — ED Provider Notes (Signed)
CSN: [id number]     Arrival date & time 12/11/14  2324 History   First MD Initiated Contact with Patient 12/11/14 2344     Chief Complaint  Patient presents with  . Near Syncope     (Consider location/radiation/quality/duration/timing/severity/associated sxs/prior Treatment) HPI   34 year old female brought here via EMS for evaluation of "an allergy attack". Patient and husband went to a movie tonight. Afterward but went to chief complaint restaurant and ate a salad bowl which they have eaten the past. While driving home patient developed a tingling sensation around her lips. A few minutes later, patient felt lightheadedness and had a near syncopal episode. She felt her fingers went stiff, having difficulty holding objects and also having chills. Husband immediately call EMS. Once they arrive, patient appears to be hyperventilating, complaining of tingling sensation around mouth but felt that her lips or improving. Patient is unsure if this is an allergic reaction. She denies any recent soap detergent changes, new pets, or new medication. She denies having anxiety or having panic attack. No complaint of throat swelling, difficulty breathing, having chest pain or shortness of breath. She denies abdominal cramping nausea vomiting diarrhea. She did recall 3 separate episodes of syncope after giving blood in the past.  She does not have any food or drugs allergies. Normal stress level.  Past Medical History  Diagnosis Date  . Hyperlipidemia   . Heartburn     occasional   Past Surgical History  Procedure Laterality Date  . Diagnostic laparoscopy  2008    RIGHT OVARION CYST  . Cesarean section  2009    [facility]  . Robotic assisted laparoscopic ovarian cystectomy Left 08/19/2014    Procedure: ROBOTIC ASSISTED LAPAROSCOPIC OVARIAN CYSTECTOMY;  Surgeon: Lovenia Kim, MD;  Location: [facility];  Service: Gynecology;  Laterality: Left;   Family History  Problem Relation Age of Onset  . Hypertension  Mother    History  Substance Use Topics  . Smoking status: Never Smoker   . Smokeless tobacco: Never Used  . Alcohol Use: No   OB History    No data available     Review of Systems  All other systems reviewed and are negative.     Allergies  Review of patient's allergies indicates no known allergies.  Home Medications   Prior to Admission medications   Medication Sig Start Date End Date Taking? Authorizing Provider  Alpha-D-Galactosidase (BEANO PO) Take 1 tablet by mouth daily as needed (For gas.).    Historical Provider, MD  cyclobenzaprine (FLEXERIL) 5 MG tablet Take one tablet at night as needed for muscle relaxant the Patient not taking: Reported on 08/03/2014 04/17/13   Darlyne Russian, MD  fenofibrate (TRICOR) 145 MG tablet Take 145 mg by mouth at bedtime.     Historical Provider, MD  meloxicam (MOBIC) 7.5 MG tablet Take 1-2 tablets daily as an anti-inflammatory Patient not taking: Reported on 08/03/2014 04/17/13   Darlyne Russian, MD  oxyCODONE-acetaminophen (ROXICET) 5-325 MG per tablet Take 1-2 tablets by mouth every 4 (four) hours as needed for severe pain. 08/19/14   Brien Few, MD  ranitidine (ZANTAC) 150 MG tablet Take 150 mg by mouth daily as needed for heartburn.    Historical Provider, MD   BP 123/83 mmHg  Pulse 92  Temp(Src) 98 F (36.7 C)  Resp 20  SpO2 100% Physical Exam  Constitutional: She is oriented to person, place, and time. She appears well-developed and well-nourished. No distress.  HENT:  Head: Atraumatic.  Mouth/Throat: Oropharynx is clear and moist.  Lips are normal, no tongue edema, no trismus, no obvious airway compromise.  Eyes: Conjunctivae are normal.  Neck: Normal range of motion. Neck supple. No tracheal deviation present.  Cardiovascular: Normal rate and regular rhythm.   Pulmonary/Chest: Effort normal and breath sounds normal. No respiratory distress. She has no wheezes. She exhibits no tenderness.  Abdominal: Soft. Bowel sounds are  normal. She exhibits no distension. There is no tenderness.  Musculoskeletal: She exhibits tenderness. She exhibits no edema.  Neurological: She is alert and oriented to person, place, and time.  Skin: No rash noted.  Psychiatric: She has a normal mood and affect.  Nursing note and vitals reviewed.   ED Course  Procedures (including critical care time)  Patient presents for a near syncopal episode and symptoms suggestive of a possible panic attack without specific triggers. No lips edema and no obvious evidence to suggest anaphylaxis. Will obtain EKG, and orthostatic vital signs. Will give IV fluid, Benadryl, and Pepcid. Plan to monitor however anticipate discharge.  12:30 AM Normal orthostatic vital sign. ECG without arrhythmia or acute ischemic changes.   1:00 AM Patient currently receiving IV fluid. Her vital signs stable. Anticipate discharge with follow-up with PCP for further valuation.  Care discussed with oncoming provider who will d/c pt pending istat chem8 and reassessment.    Labs Review Labs Reviewed  I-STAT CHEM 8, ED - Abnormal; Notable for the following:    Glucose, Bld 114 (*)    All other components within normal limits  I-STAT BETA HCG BLOOD, ED ([facility], [facility], [facility] ONLY)    Imaging Review No results found.   EKG Interpretation None      Date: 12/12/2014  Rate: 79  Rhythm: normal sinus rhythm  QRS Axis: normal  Intervals: normal  ST/T Wave abnormalities: normal  Conduction Disutrbances: none  Narrative Interpretation:   Old EKG Reviewed: No significant changes noted     MDM   Final diagnoses:  Near syncope    BP 123/83 mmHg  Pulse 92  Temp(Src) 98 F (36.7 C)  Resp 20  SpO2 100%     Domenic Moras, PA-C 12/12/14 1455  Julianne Rice, MD 12/21/14 [phone]

## 2014-12-11 NOTE — ED Notes (Signed)
Brought in by EMS from [employer] store with c/o allergic reaction.  Pt called EMS because she felt she was having "an allergy attack"---- she felt that her upper lip "is breaking out".  No s/s swelling or s/s skin issues noted by EMS on arrival; but on their arrival at the scene, pt reported that she has had a near syncopal episode.  Pt reported that she was hyperventilating--- "having a panic attack because of having an allergic reaction".   Pt c/o "weakness" to EMS--- pt reported that she has not been drinking enough fluids today---- she feels dehydrated.

## 2014-12-11 NOTE — ED Notes (Signed)
Bed: WA01 Expected date:  Expected time:  Means of arrival:  Comments: EMS 34yo allergic reaction Jessica Werner

## 2014-12-12 LAB — I-STAT CHEM 8, ED
BUN: 6 mg/dL (ref 6–20)
Calcium, Ion: 1.18 mmol/L (ref 1.12–1.23)
Chloride: 106 mmol/L (ref 101–111)
Creatinine, Ser: 0.7 mg/dL (ref 0.44–1.00)
Glucose, Bld: 114 mg/dL — ABNORMAL HIGH (ref 65–99)
HCT: 36 % (ref 36.0–46.0)
Hemoglobin: 12.2 g/dL (ref 12.0–15.0)
Potassium: 3.9 mmol/L (ref 3.5–5.1)
Sodium: 142 mmol/L (ref 135–145)
TCO2: 22 mmol/L (ref 0–100)

## 2014-12-12 LAB — I-STAT BETA HCG BLOOD, ED (MC, WL, AP ONLY)

## 2014-12-12 MED ORDER — FAMOTIDINE 20 MG PO TABS
20.0000 mg | ORAL_TABLET | Freq: Once | ORAL | Status: AC
  Administered 2014-12-12: 20 mg via ORAL
  Filled 2014-12-12: qty 1

## 2014-12-12 MED ORDER — DIPHENHYDRAMINE HCL 50 MG/ML IJ SOLN
25.0000 mg | Freq: Once | INTRAMUSCULAR | Status: AC
  Administered 2014-12-12: 25 mg via INTRAVENOUS
  Filled 2014-12-12: qty 1

## 2014-12-12 MED ORDER — SODIUM CHLORIDE 0.9 % IV BOLUS (SEPSIS)
1000.0000 mL | Freq: Once | INTRAVENOUS | Status: AC
  Administered 2014-12-12: 1000 mL via INTRAVENOUS

## 2014-12-12 NOTE — Discharge Instructions (Signed)
Near-Syncope Near-syncope (commonly known as near fainting) is sudden weakness, dizziness, or feeling like you might pass out. During an episode of near-syncope, you may also develop pale skin, have tunnel vision, or feel sick to your stomach (nauseous). Near-syncope may occur when getting up after sitting or while standing for a long time. It is caused by a sudden decrease in blood flow to the brain. This decrease can result from various causes or triggers, most of which are not serious. However, because near-syncope can sometimes be a sign of something serious, a medical evaluation is required. The specific cause is often not determined. HOME CARE INSTRUCTIONS  Monitor your condition for any changes. The following actions may help to alleviate any discomfort you are experiencing:  Have someone stay with you until you feel stable.  Lie down right away and prop your feet up if you start feeling like you might faint. Breathe deeply and steadily. Wait until all the symptoms have passed. Most of these episodes last only a few minutes. You may feel tired for several hours.   Drink enough fluids to keep your urine clear or pale yellow.   If you are taking blood pressure or heart medicine, get up slowly when seated or lying down. Take several minutes to sit and then stand. This can reduce dizziness.  Follow up with your health care provider as directed. SEEK IMMEDIATE MEDICAL CARE IF:   You have a severe headache.   You have unusual pain in the chest, abdomen, or back.   You are bleeding from the mouth or rectum, or you have black or tarry stool.   You have an irregular or very fast heartbeat.   You have repeated fainting or have seizure-like jerking during an episode.   You faint when sitting or lying down.   You have confusion.   You have difficulty walking.   You have severe weakness.   You have vision problems.  MAKE SURE YOU:   Understand these instructions.  Will  watch your condition.  Will get help right away if you are not doing well or get worse. Document Released: 06/18/2005 Document Revised: 06/23/2013 Document Reviewed: 11/21/2012 ExitCare Patient Information 2015 [employer]. This information is not intended to replace advice given to you by your health care provider. Make sure you discuss any questions you have with your health care provider.  

## 2014-12-12 NOTE — ED Provider Notes (Signed)
0245 - Patient care assumed from Jessica Moras, Jessica Werner at shift change. Patient presenting to the ED for evaluation of lip swelling and "tingling" as well as b/l hand contractures. Symptoms associated with near syncope, but no true syncopal event. Patient hydrated with IVF. During my assessment she states that she feels better, overall. She is able to ambulate in the hall and to the bathroom without difficulty. Labs reviewed. No electrolyte abnormalities. Anion gap is normal. No anemia. HCG is negative.  Symptoms thought to be secondary to dehydration vs anxiety vs allergic reaction. Plan discussed with Jessica Moras, Jessica Werner which  Includes discharge if laboratory workup is reassuring. No indication for further emergent workup at this time. Will discharge with instruction of follow-up with her primary care doctor on Monday. Return precautions discussed and provided. Patient agreeable to plan with no unaddressed concerns. Patient discharged in good condition.   Filed Vitals:   12/11/14 2328 12/12/14 0225  BP: 123/83 104/59  Pulse: 92 80  Temp: 98 F (36.7 C)   Resp: 20 18  SpO2: 100% 99%     Antonietta Breach, Jessica Werner 12/12/14 0256  Julianne Rice, MD 12/12/14 [phone]

## 2015-03-29 ENCOUNTER — Other Ambulatory Visit: Payer: Self-pay | Admitting: Gastroenterology

## 2015-03-29 DIAGNOSIS — R0789 Other chest pain: Secondary | ICD-10-CM

## 2015-04-11 ENCOUNTER — Encounter (HOSPITAL_COMMUNITY)
Admission: RE | Admit: 2015-04-11 | Discharge: 2015-04-11 | Disposition: A | Discharge status: Home / Self Care | Payer: BLUE CROSS/BLUE SHIELD | Source: Ambulatory Visit | Attending: Gastroenterology | Admitting: Gastroenterology

## 2015-04-11 ENCOUNTER — Ambulatory Visit (HOSPITAL_COMMUNITY)
Admission: RE | Admit: 2015-04-11 | Discharge: 2015-04-11 | Disposition: A | Discharge status: Home / Self Care | Payer: BLUE CROSS/BLUE SHIELD | Source: Ambulatory Visit | Attending: Gastroenterology | Admitting: Gastroenterology

## 2015-04-11 DIAGNOSIS — R932 Abnormal findings on diagnostic imaging of liver and biliary tract: Secondary | ICD-10-CM | POA: Insufficient documentation

## 2015-04-11 DIAGNOSIS — R079 Chest pain, unspecified: Secondary | ICD-10-CM | POA: Insufficient documentation

## 2015-04-11 DIAGNOSIS — E785 Hyperlipidemia, unspecified: Secondary | ICD-10-CM | POA: Insufficient documentation

## 2015-04-11 DIAGNOSIS — R0789 Other chest pain: Secondary | ICD-10-CM

## 2015-04-11 DIAGNOSIS — R109 Unspecified abdominal pain: Secondary | ICD-10-CM | POA: Insufficient documentation

## 2015-04-11 MED ORDER — SINCALIDE 5 MCG IJ SOLR
0.0200 ug/kg | Freq: Once | INTRAMUSCULAR | Status: AC
Start: 2015-04-11 — End: 2015-04-11
  Administered 2015-04-11: 1.19 ug via INTRAVENOUS

## 2015-04-11 MED ORDER — TECHNETIUM TC 99M MEBROFENIN IV KIT
5.3000 | PACK | Freq: Once | INTRAVENOUS | Status: DC | PRN
  Administered 2015-04-11: 5 via INTRAVENOUS
  Filled 2015-04-11: qty 6

## 2015-04-11 MED ORDER — SINCALIDE 5 MCG IJ SOLR
INTRAMUSCULAR | Status: AC
  Filled 2015-04-11: qty 5

## 2015-12-07 IMAGING — US US ABDOMEN COMPLETE
1 series · 13 of 25 positions shown · non-contrast
Comparison: None.

CLINICAL DATA: Postprandial chest and abdominal pain. History of
hyperlipidemia.

EXAM:
ULTRASOUND ABDOMEN COMPLETE

[Series 1: us abdomen complete · 0.20mm/px · 13 of 93 slices shown]
[im 1/93]
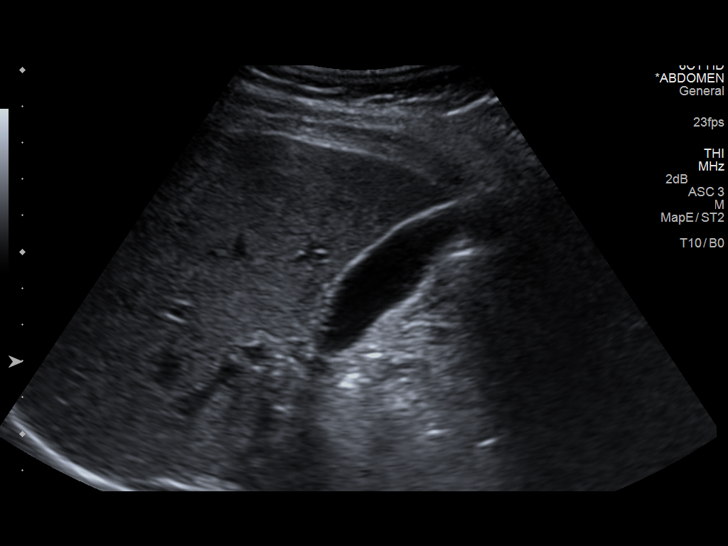
[im 8/93]
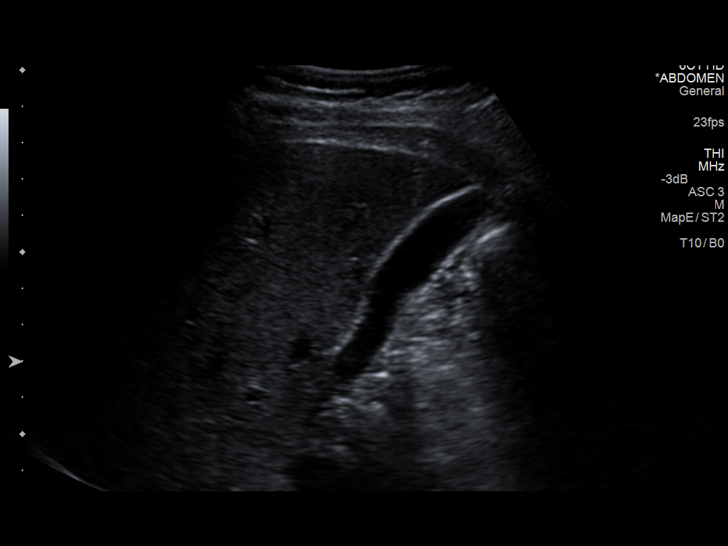
[im 16/93]
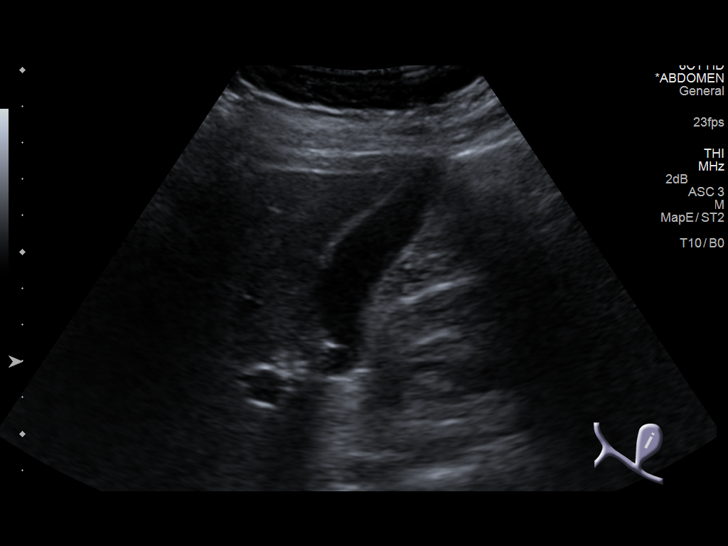
[im 24/93]
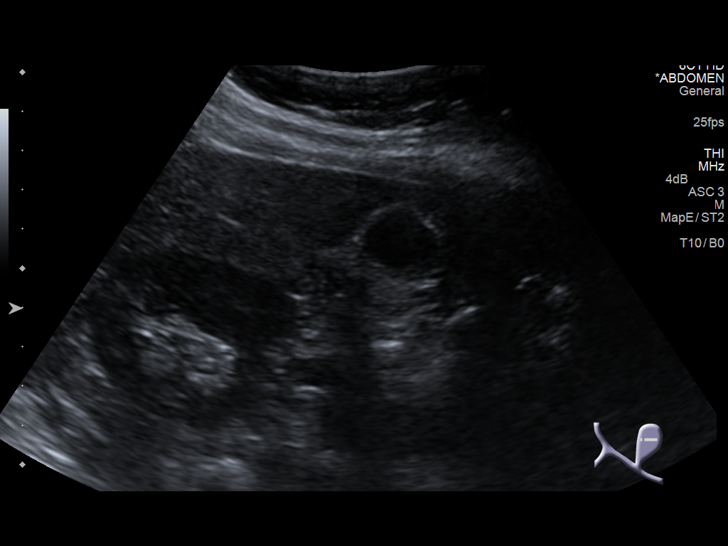
[im 31/93]
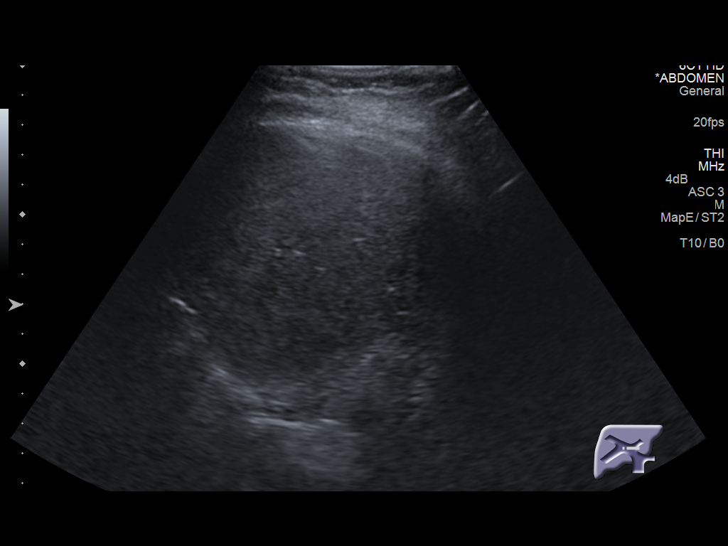
[im 39/93]
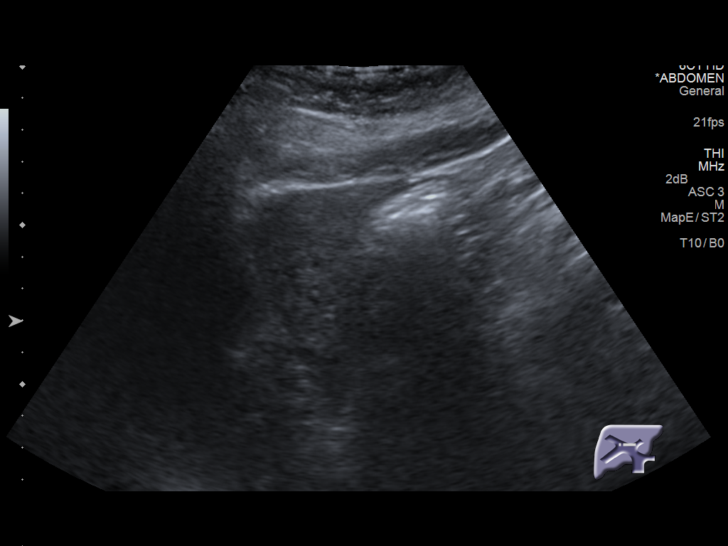
[im 47/93]
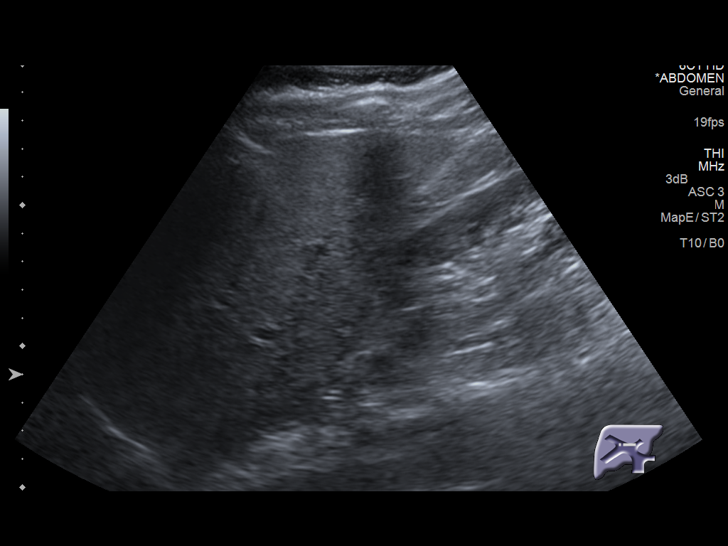
[im 54/93]
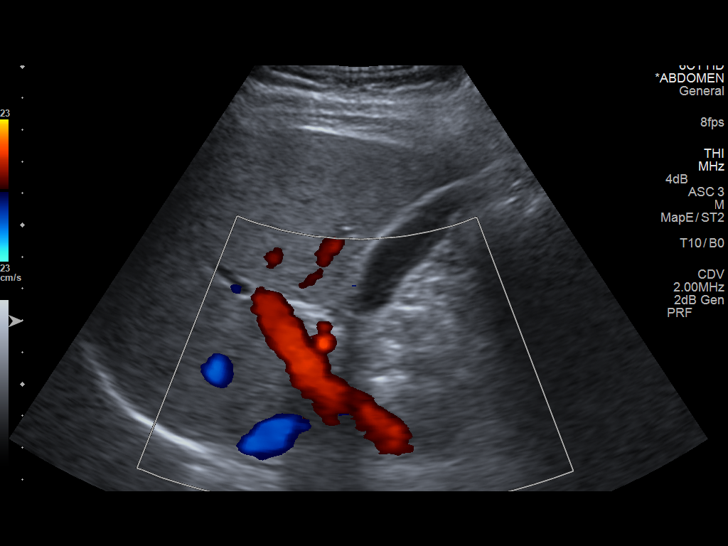
[im 62/93]
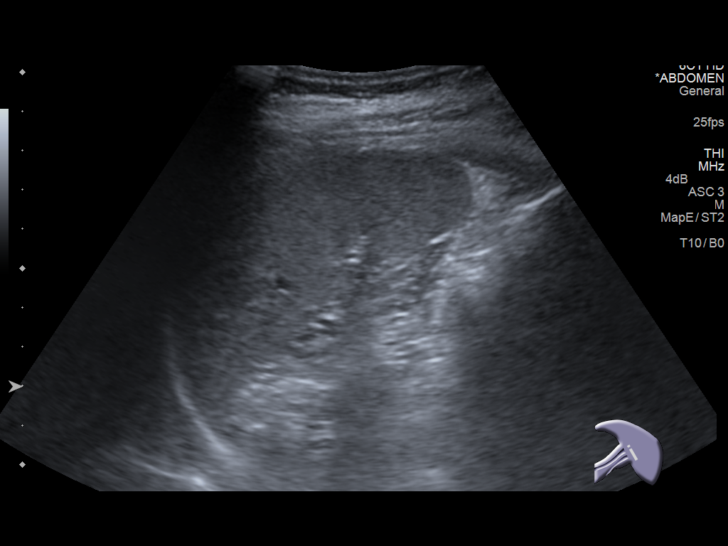
[im 70/93]
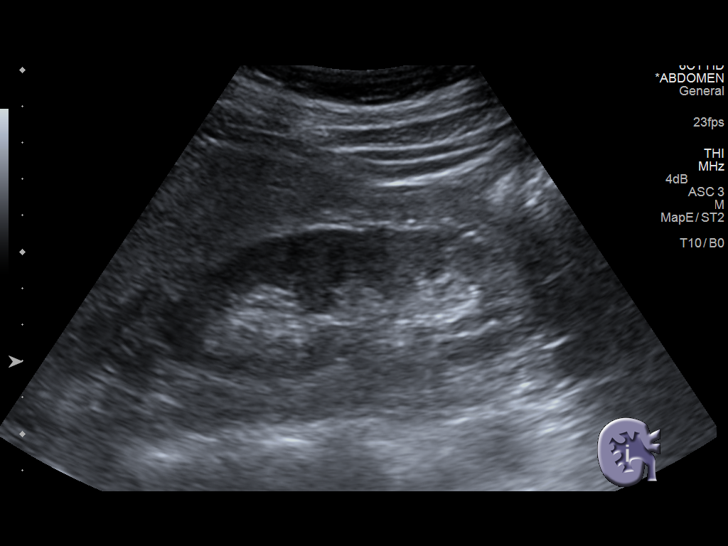
[im 77/93]
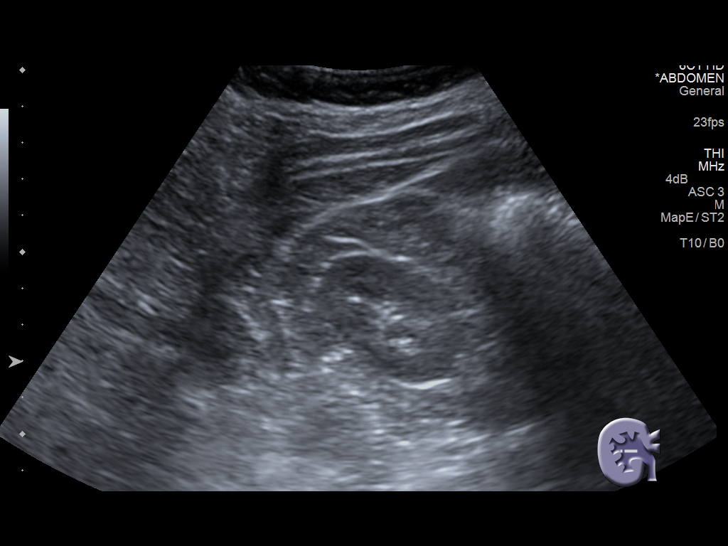
[im 85/93]
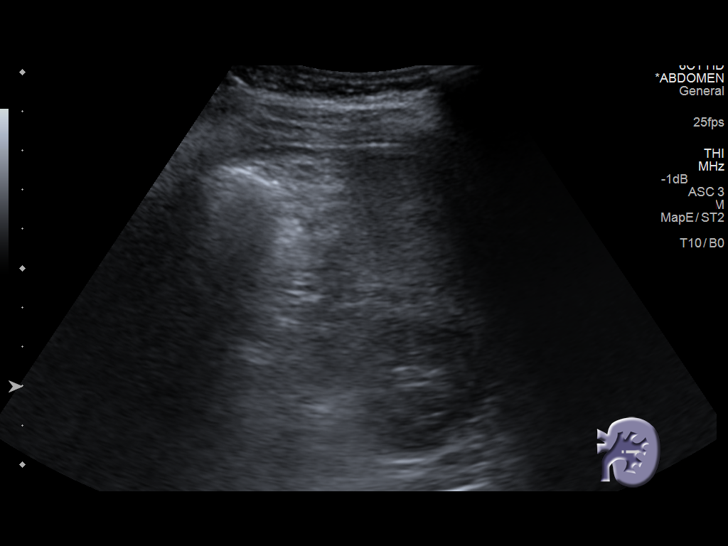
[im 93/93]
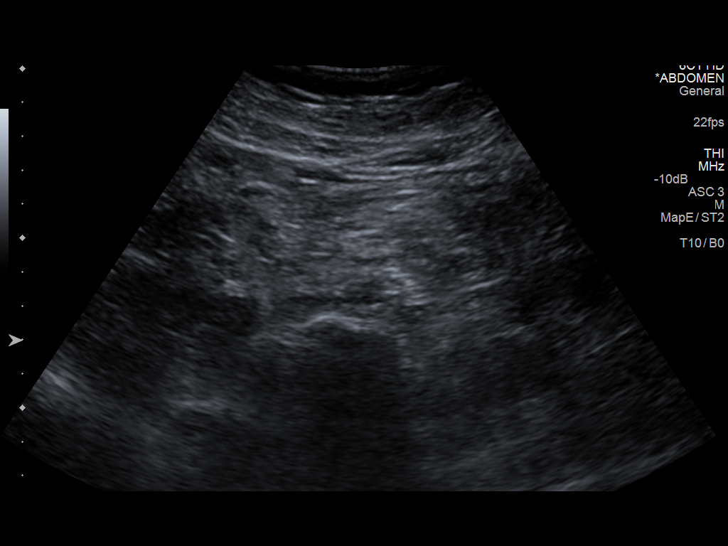

[13 of 25 positions shown; findings below may reference images not displayed]

FINDINGS: Gallbladder: Sonographically normal. No echogenic gallstones or gall
sludge. No gallbladder wall thickening or pericholecystic fluid.
Negative sonographic Murphy's sign.

Common bile duct: Diameter: Normal in size measuring 3.1 mm in
diameter

Liver: There is mild diffuse increased slightly coarsened
echogenicity of the hepatic parenchyma suggestive of hepatic
steatosis (representative images 29 and 37). No hepatic mass. No
intrahepatic biliary duct dilatation. No ascites.

IVC: No abnormality visualized.

Pancreas: Limited visualization of the pancreatic head and neck is
normal. Visualization of the pancreatic body and tail is obscured by
bowel gas.

Spleen: Normal in size measuring 7.1 cm in length

Right Kidney: Normal cortical thickness, echogenicity and size,
measuring 11.0 cm in length. No focal renal lesions. No echogenic
renal stones. No urinary obstruction.

Left Kidney: Normal cortical thickness, echogenicity and size,
measuring 11.4 cm in length. Incidental note is made of a prominent
renal column of Bertin. No focal renal lesions. No echogenic renal
stones. No urinary obstruction.

Abdominal aorta: No aneurysm visualized.

Other findings: None.
IMPRESSION: 1. No explanation for patient's postprandial chest and abdominal
pain. Specifically, no evidence of cholelithiasis.
2. Findings suggestive of hepatic steatosis. Correlation with LFTs
is recommended.

## 2016-02-23 IMAGING — NM NM HEPATO W/GB/PHARM/[PERSON_NAME]
3 series · 13 of 13 positions shown · non-contrast
Comparison: Abdominal ultrasound of today's date

CLINICAL DATA: Esophageal reflux symptoms for the past 10 months,
postprandial chest and abdominal discomfort

EXAM:
NUCLEAR MEDICINE HEPATOBILIARY IMAGING WITH GALLBLADDER EF
TECHNIQUE: Sequential images of the abdomen were obtained [DATE] minutes
following intravenous administration of radiopharmaceutical. After
slow intravenous infusion of 1.19 micrograms Cholecystokinin,
gallbladder ejection fraction was determined.
RADIOPHARMACEUTICALS:  5.3 mCi Yc-FFm Choletec IV

[he hepatobiliary · 1 of 1 slices shown (1 of 3)]
[im 1/1]
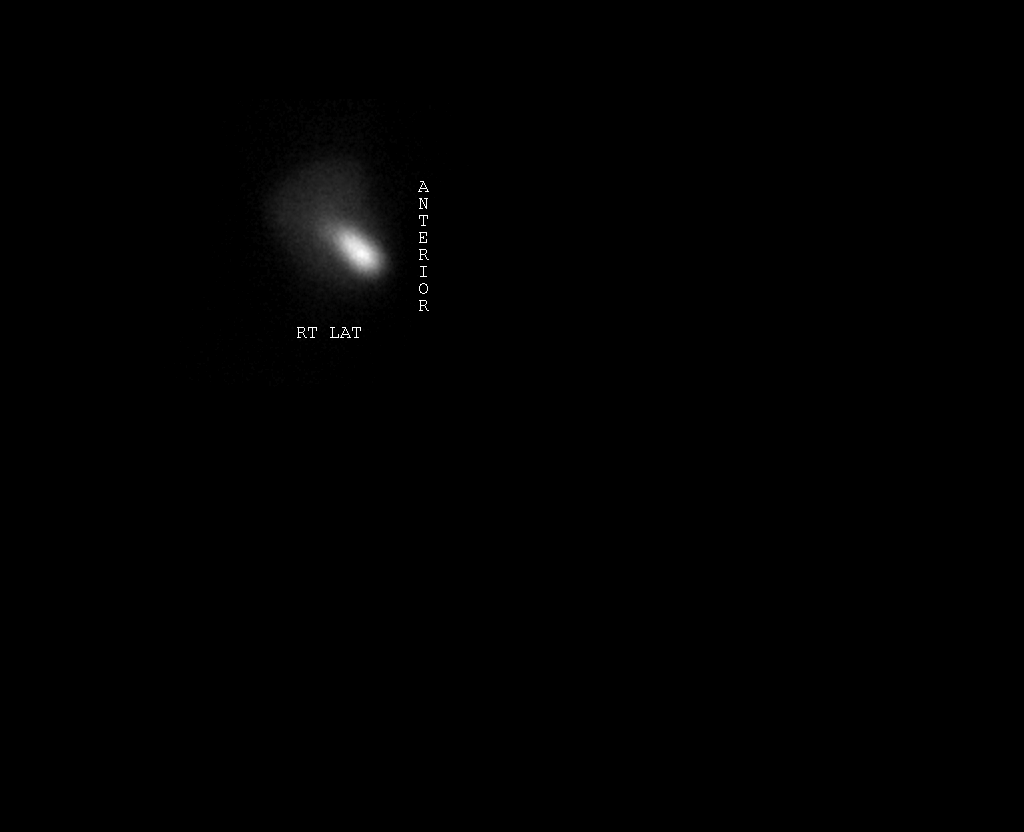

[he hepatobiliary · 3.43mm/px · 6 of 60 frames shown (2 of 3)]
[frame 6/60]
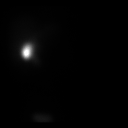
[frame 16/60]
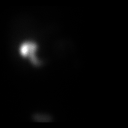
[frame 26/60]
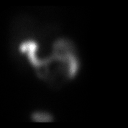
[frame 36/60]
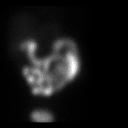
[frame 46/60]
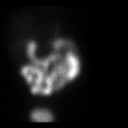
[frame 56/60]
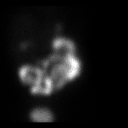

[he hepatobiliary · 3.43mm/px · 6 of 48 frames shown (3 of 3)]
[frame 5/48]
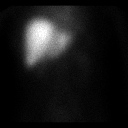
[frame 13/48]
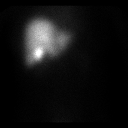
[frame 21/48]
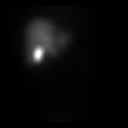
[frame 29/48]
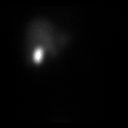
[frame 37/48]
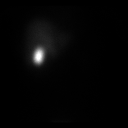
[frame 45/48]
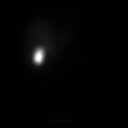

[13 of 13 positions shown; findings below may reference images not displayed]

FINDINGS: There is adequate uptake of the radiopharmaceutical by the liver.
The gallbladder and common bile duct are visible by 15 minutes.
Following CCK administration the 45 minutes gallbladder ejection
fraction is 98%. The patient experienced no symptoms during CCK
infusion.. At 45 min, normal ejection fraction is greater than 40%.
IMPRESSION: Normal hepatobiliary scan and normal gallbladder ejection fraction.

## 2016-07-03 DIAGNOSIS — R102 Pelvic and perineal pain: Secondary | ICD-10-CM | POA: Diagnosis not present

## 2016-07-03 DIAGNOSIS — R100 Acute abdomen: Secondary | ICD-10-CM | POA: Diagnosis not present

## 2016-07-11 ENCOUNTER — Encounter (HOSPITAL_COMMUNITY): Payer: Self-pay | Admitting: *Deleted

## 2016-07-12 ENCOUNTER — Other Ambulatory Visit: Payer: Self-pay | Admitting: Obstetrics and Gynecology

## 2016-07-12 DIAGNOSIS — Z419 Encounter for procedure for purposes other than remedying health state, unspecified: Secondary | ICD-10-CM

## 2016-07-19 ENCOUNTER — Ambulatory Visit (HOSPITAL_COMMUNITY): Payer: 59

## 2016-07-19 ENCOUNTER — Ambulatory Visit (HOSPITAL_COMMUNITY)
Admission: RE | Admit: 2016-07-19 | Discharge: 2016-07-19 | Disposition: A | Discharge status: Home / Self Care | Payer: 59 | Source: Ambulatory Visit | Attending: Obstetrics and Gynecology | Admitting: Obstetrics and Gynecology

## 2016-07-19 ENCOUNTER — Encounter (HOSPITAL_COMMUNITY): Payer: Self-pay | Admitting: Anesthesiology

## 2016-07-19 ENCOUNTER — Ambulatory Visit (HOSPITAL_COMMUNITY): Payer: 59 | Admitting: Anesthesiology

## 2016-07-19 ENCOUNTER — Encounter (HOSPITAL_COMMUNITY)
Admission: RE | Disposition: A | Discharge status: Home / Self Care | Payer: Self-pay | Source: Ambulatory Visit | Attending: Obstetrics and Gynecology

## 2016-07-19 DIAGNOSIS — R58 Hemorrhage, not elsewhere classified: Secondary | ICD-10-CM

## 2016-07-19 DIAGNOSIS — Z332 Encounter for elective termination of pregnancy: Secondary | ICD-10-CM | POA: Diagnosis not present

## 2016-07-19 HISTORY — PX: DILATION AND EVACUATION: SHX1459

## 2016-07-19 HISTORY — DX: Gestational diabetes mellitus in pregnancy, unspecified control: O24.419

## 2016-07-19 LAB — CBC
HEMATOCRIT: 37.3 % (ref 36.0–46.0)
HEMOGLOBIN: 13.2 g/dL (ref 12.0–15.0)
MCH: 29.4 pg (ref 26.0–34.0)
MCHC: 35.4 g/dL (ref 30.0–36.0)
MCV: 83.1 fL (ref 78.0–100.0)
Platelets: 192 10*3/uL (ref 150–400)
RBC: 4.49 MIL/uL (ref 3.87–5.11)
RDW: 13 % (ref 11.5–15.5)
WBC: 10.2 10*3/uL (ref 4.0–10.5)

## 2016-07-19 LAB — TYPE AND SCREEN
ABO/RH(D): B POS
Antibody Screen: NEGATIVE

## 2016-07-19 SURGERY — DILATION AND EVACUATION, UTERUS
Anesthesia: General | Site: Vagina

## 2016-07-19 MED ORDER — FENTANYL CITRATE (PF) 100 MCG/2ML IJ SOLN
INTRAMUSCULAR | Status: DC | PRN
  Administered 2016-07-19: 25 ug via INTRAVENOUS
  Administered 2016-07-19: 50 ug via INTRAVENOUS

## 2016-07-19 MED ORDER — CEFAZOLIN SODIUM-DEXTROSE 2-4 GM/100ML-% IV SOLN
2.0000 g | INTRAVENOUS | Status: AC
  Administered 2016-07-19: 2 g via INTRAVENOUS

## 2016-07-19 MED ORDER — SCOPOLAMINE 1 MG/3DAYS TD PT72
MEDICATED_PATCH | TRANSDERMAL | Status: AC
  Administered 2016-07-19: 1.5 mg via TRANSDERMAL
  Filled 2016-07-19: qty 1

## 2016-07-19 MED ORDER — MIDAZOLAM HCL 2 MG/2ML IJ SOLN
INTRAMUSCULAR | Status: DC | PRN
  Administered 2016-07-19: 1 mg via INTRAVENOUS

## 2016-07-19 MED ORDER — MIDAZOLAM HCL 2 MG/2ML IJ SOLN
INTRAMUSCULAR | Status: AC
Start: 2016-07-19 — End: 2016-07-19
  Filled 2016-07-19: qty 2

## 2016-07-19 MED ORDER — PROPOFOL 10 MG/ML IV BOLUS
INTRAVENOUS | Status: DC | PRN
  Administered 2016-07-19: 150 mg via INTRAVENOUS
  Administered 2016-07-19: 50 mg via INTRAVENOUS

## 2016-07-19 MED ORDER — ONDANSETRON HCL 4 MG/2ML IJ SOLN
INTRAMUSCULAR | Status: DC | PRN
  Administered 2016-07-19: 4 mg via INTRAVENOUS

## 2016-07-19 MED ORDER — LACTATED RINGERS IV SOLN: INTRAVENOUS | Status: DC

## 2016-07-19 MED ORDER — KETOROLAC TROMETHAMINE 30 MG/ML IJ SOLN
INTRAMUSCULAR | Status: AC
  Filled 2016-07-19: qty 1

## 2016-07-19 MED ORDER — BUPIVACAINE HCL (PF) 0.25 % IJ SOLN
INTRAMUSCULAR | Status: AC
  Filled 2016-07-19: qty 10

## 2016-07-19 MED ORDER — LIDOCAINE HCL (CARDIAC) 20 MG/ML IV SOLN
INTRAVENOUS | Status: AC
  Filled 2016-07-19: qty 5

## 2016-07-19 MED ORDER — SCOPOLAMINE 1 MG/3DAYS TD PT72
1.0000 | MEDICATED_PATCH | Freq: Once | TRANSDERMAL | Status: DC
  Administered 2016-07-19: 1.5 mg via TRANSDERMAL

## 2016-07-19 MED ORDER — DEXAMETHASONE SODIUM PHOSPHATE 10 MG/ML IJ SOLN
INTRAMUSCULAR | Status: DC | PRN
  Administered 2016-07-19: 4 mg via INTRAVENOUS

## 2016-07-19 MED ORDER — DEXAMETHASONE SODIUM PHOSPHATE 4 MG/ML IJ SOLN
INTRAMUSCULAR | Status: AC
  Filled 2016-07-19: qty 1

## 2016-07-19 MED ORDER — TRAMADOL HCL 50 MG PO TABS: 50.0000 mg | ORAL_TABLET | Freq: Four times a day (QID) | ORAL | 0 refills | Status: DC | PRN

## 2016-07-19 MED ORDER — ONDANSETRON HCL 4 MG/2ML IJ SOLN
INTRAMUSCULAR | Status: AC
  Filled 2016-07-19: qty 2

## 2016-07-19 MED ORDER — LIDOCAINE HCL (CARDIAC) 20 MG/ML IV SOLN
INTRAVENOUS | Status: DC | PRN
  Administered 2016-07-19: 30 mg via INTRAVENOUS
  Administered 2016-07-19: 70 mg via INTRAVENOUS

## 2016-07-19 MED ORDER — KETOROLAC TROMETHAMINE 30 MG/ML IJ SOLN
INTRAMUSCULAR | Status: DC | PRN
  Administered 2016-07-19: 30 mg via INTRAVENOUS

## 2016-07-19 MED ORDER — BUPIVACAINE HCL (PF) 0.25 % IJ SOLN
INTRAMUSCULAR | Status: DC | PRN
  Administered 2016-07-19: 20 mL

## 2016-07-19 MED ORDER — PROPOFOL 10 MG/ML IV BOLUS
INTRAVENOUS | Status: AC
  Filled 2016-07-19: qty 20

## 2016-07-19 MED ORDER — METOCLOPRAMIDE HCL 5 MG/ML IJ SOLN: 10.0000 mg | Freq: Once | INTRAMUSCULAR | Status: DC | PRN

## 2016-07-19 MED ORDER — MEPERIDINE HCL 25 MG/ML IJ SOLN: 6.2500 mg | INTRAMUSCULAR | Status: DC | PRN

## 2016-07-19 MED ORDER — FENTANYL CITRATE (PF) 100 MCG/2ML IJ SOLN: 25.0000 ug | INTRAMUSCULAR | Status: DC | PRN

## 2016-07-19 MED ORDER — FENTANYL CITRATE (PF) 100 MCG/2ML IJ SOLN
INTRAMUSCULAR | Status: AC
Start: 2016-07-19 — End: 2016-07-19
  Filled 2016-07-19: qty 2

## 2016-07-19 MED ORDER — LACTATED RINGERS IV SOLN
INTRAVENOUS | Status: DC
  Administered 2016-07-19: 125 mL/h via INTRAVENOUS

## 2016-07-19 SURGICAL SUPPLY — 19 items
CATH ROBINSON RED A/P 16FR (CATHETERS) ×3 IMPLANT
CLOTH BEACON ORANGE TIMEOUT ST (SAFETY) ×3 IMPLANT
DECANTER SPIKE VIAL GLASS SM (MISCELLANEOUS) ×3 IMPLANT
GLOVE BIO SURGEON STRL SZ7.5 (GLOVE) ×3 IMPLANT
GLOVE BIOGEL PI IND STRL 7.0 (GLOVE) ×1 IMPLANT
GLOVE BIOGEL PI INDICATOR 7.0 (GLOVE) ×2
GOWN STRL REUS W/TWL LRG LVL3 (GOWN DISPOSABLE) ×6 IMPLANT
KIT BERKELEY 1ST TRIMESTER 3/8 (MISCELLANEOUS) ×3 IMPLANT
NS IRRIG 1000ML POUR BTL (IV SOLUTION) ×3 IMPLANT
PACK VAGINAL MINOR WOMEN LF (CUSTOM PROCEDURE TRAY) ×3 IMPLANT
PAD OB MATERNITY 4.3X12.25 (PERSONAL CARE ITEMS) ×3 IMPLANT
PAD PREP 24X48 CUFFED NSTRL (MISCELLANEOUS) ×3 IMPLANT
SET BERKELEY SUCTION TUBING (SUCTIONS) ×3 IMPLANT
TOWEL OR 17X24 6PK STRL BLUE (TOWEL DISPOSABLE) ×6 IMPLANT
VACURETTE 10 RIGID CVD (CANNULA) IMPLANT
VACURETTE 12 RIGID CVD (CANNULA) ×3 IMPLANT
VACURETTE 7MM CVD STRL WRAP (CANNULA) IMPLANT
VACURETTE 8 RIGID CVD (CANNULA) IMPLANT
VACURETTE 9 RIGID CVD (CANNULA) IMPLANT

## 2016-07-19 NOTE — Anesthesia Preprocedure Evaluation (Signed)
Anesthesia Evaluation  Patient identified by MRN, date of birth, ID band Patient awake    Reviewed: Allergy & Precautions, NPO status , Patient's Chart, lab work & pertinent test results  History of Anesthesia Complications Negative for: history of anesthetic complications  Airway Mallampati: II  TM Distance: >3 FB Neck ROM: Full    Dental no notable dental hx. (+) Dental Advisory Given   Pulmonary neg pulmonary ROS,    Pulmonary exam normal breath sounds clear to auscultation       Cardiovascular negative cardio ROS Normal cardiovascular exam Rhythm:Regular Rate:Normal     Neuro/Psych negative neurological ROS  negative psych ROS   GI/Hepatic Neg liver ROS, GERD  Medicated and Controlled,  Endo/Other  negative endocrine ROSneg diabetes  Renal/GU negative Renal ROS  negative genitourinary   Musculoskeletal negative musculoskeletal ROS (+)   Abdominal   Peds negative pediatric ROS (+)  Hematology negative hematology ROS (+)   Anesthesia Other Findings   Reproductive/Obstetrics negative OB ROS                             Anesthesia Physical  Anesthesia Plan  ASA: II  Anesthesia Plan: General   Post-op Pain Management:    Induction: Intravenous  Airway Management Planned: LMA  Additional Equipment:   Intra-op Plan:   Post-operative Plan:   Informed Consent: I have reviewed the patients History and Physical, chart, labs and discussed the procedure including the risks, benefits and alternatives for the proposed anesthesia with the patient or authorized representative who has indicated his/her understanding and acceptance.   Dental advisory given  Plan Discussed with: CRNA  Anesthesia Plan Comments:         Anesthesia Quick Evaluation

## 2016-07-19 NOTE — Op Note (Signed)
07/19/2016  1:41 PM  PATIENT:  Jessica Werner  36 y.o. female  PRE-OPERATIVE DIAGNOSIS:  Elective  POST-OPERATIVE DIAGNOSIS:  Elective Abortion-late trimester  PROCEDURE:  Procedure(s): DILATATION AND EVACUATION WITH ULTRASOUND GUIDANCE  SURGEON:  Surgeon(s): Brien Few, MD  ASSISTANTS: none   ANESTHESIA:   local and general  ESTIMATED BLOOD LOSS: minimal  DRAINS: none   LOCAL MEDICATIONS USED:  MARCAINE    and Amount: 20 ml  SPECIMEN:  Source of Specimen:  POC  DISPOSITION OF SPECIMEN:  PATHOLOGY  COUNTS:  YES  DICTATION #[id number]  PLAN OF CARE: dc home  PATIENT DISPOSITION:  PACU - hemodynamically stable.

## 2016-07-19 NOTE — Anesthesia Postprocedure Evaluation (Signed)
Anesthesia Post Note  Patient: Jessica Werner  Procedure(s) Performed: Procedure(s) (LRB): DILATATION AND EVACUATION WITH ULTRASOUND GUIDANCE (N/A)  Patient location during evaluation: PACU Anesthesia Type: General Level of consciousness: awake and alert Pain management: pain level controlled Vital Signs Assessment: post-procedure vital signs reviewed and stable Respiratory status: spontaneous breathing, nonlabored ventilation, respiratory function stable and patient connected to nasal cannula oxygen Cardiovascular status: blood pressure returned to baseline and stable Postop Assessment: no signs of nausea or vomiting Anesthetic complications: no        Last Vitals:  Vitals:   07/19/16 1442 07/19/16 1505  BP:  121/76  Pulse: 81 80  Resp: 17 20  Temp: 36.9 C     Last Pain:  Vitals:   07/19/16 1159  TempSrc: Oral   Pain Goal: Patients Stated Pain Goal: 5 (07/19/16 1159)               Montez Hageman

## 2016-07-19 NOTE — Discharge Instructions (Signed)
DISCHARGE INSTRUCTIONS: D&C / D&E The following instructions have been prepared to help you care for yourself upon your return home.   Personal hygiene:  Use sanitary pads for vaginal drainage, not tampons.  Shower the day after your procedure.  NO tub baths, pools or Jacuzzis for 2-3 weeks.  Wipe front to back after using the bathroom.  Activity and limitations:  Do NOT drive or operate any equipment for 24 hours. The effects of anesthesia are still present and drowsiness may result.  Do NOT rest in bed all day.  Walking is encouraged.  Walk up and down stairs slowly.  You may resume your normal activity in one to two days or as indicated by your physician.  Sexual activity: NO intercourse for at least 2 weeks after the procedure, or as indicated by your physician.  Diet: Eat a light meal as desired this evening. You may resume your usual diet tomorrow.  Return to work: You may resume your work activities in one to two days or as indicated by your doctor.  What to expect after your surgery: Expect to have vaginal bleeding/discharge for 2-3 days and spotting for up to 10 days. It is not unusual to have soreness for up to 1-2 weeks. You may have a slight burning sensation when you urinate for the first day. Mild cramps may continue for a couple of days. You may have a regular period in 2-6 weeks.  Call your doctor for any of the following:  Excessive vaginal bleeding, saturating and changing one pad every hour.  Inability to urinate 6 hours after discharge from hospital.  Pain not relieved by pain medication.  Fever of 100.4 F or greater.  Unusual vaginal discharge or odor.   Call for an appointment:    Patients signature: ______________________  Nurses signature ________________________  Support person's signature_______________________    NO IBUPROFEN PRODUCTS (MOTRIN, ADVIL) OR ALEVE UNTIL 7:15PM TODAY.    Post Anesthesia Home Care  Instructions  Activity: Get plenty of rest for the remainder of the day. A responsible adult should stay with you for 24 hours following the procedure.  For the next 24 hours, DO NOT: -Drive a car -Paediatric nurse -Drink alcoholic beverages -Take any medication unless instructed by your physician -Make any legal decisions or sign important papers.  Meals: Start with liquid foods such as gelatin or soup. Progress to regular foods as tolerated. Avoid greasy, spicy, heavy foods. If nausea and/or vomiting occur, drink only clear liquids until the nausea and/or vomiting subsides. Call your physician if vomiting continues.  Special Instructions/Symptoms: Your throat may feel dry or sore from the anesthesia or the breathing tube placed in your throat during surgery. If this causes discomfort, gargle with warm salt water. The discomfort should disappear within 24 hours.  If you had a scopolamine patch placed behind your ear for the management of post- operative nausea and/or vomiting:  1. The medication in the patch is effective for 72 hours, after which it should be removed.  Wrap patch in a tissue and discard in the trash. Wash hands thoroughly with soap and water. 2. You may remove the patch earlier than 72 hours if you experience unpleasant side effects which may include dry mouth, dizziness or visual disturbances. 3. Avoid touching the patch. Wash your hands with soap and water after contact with the patch.

## 2016-07-19 NOTE — H&P (Signed)
Jessica Werner is an 36 y.o. female. History of unplanned pregnancy. Concerned about possible anomalies. Wants to proceed with TOP. Sono done in office 07/03/16 weeks ago. Pt counseled about TOP at that time. Pt and husband have decided to proceed with TOP. All options discussed.   Pertinent Gynecological History: Menses: flow is moderate Bleeding: na Contraception: none DES exposure: denies Blood transfusions: none Sexually transmitted diseases: no past history Previous GYN Procedures: na  Last mammogram: na Date: na Last pap: normal Date: 2017 OB History: G2, P1   Menstrual History: Menarche age: 15 No LMP recorded. Patient is not currently having periods (Reason: Other).    Past Medical History:  Diagnosis Date  . Gestational diabetes 2009  . Heartburn    occasional  . Hyperlipidemia     Past Surgical History:  Procedure Laterality Date  . CESAREAN SECTION  2009   [facility]  . DIAGNOSTIC LAPAROSCOPY  2008   RIGHT OVARION CYST  . ROBOTIC ASSISTED LAPAROSCOPIC OVARIAN CYSTECTOMY Left 08/19/2014   Procedure: ROBOTIC ASSISTED LAPAROSCOPIC OVARIAN CYSTECTOMY;  Surgeon: Lovenia Kim, MD;  Location: [facility];  Service: Gynecology;  Laterality: Left;    Family History  Problem Relation Age of Onset  . Hypertension Mother     Social History:  reports that she has never smoked. She has never used smokeless tobacco. She reports that she does not drink alcohol or use drugs.  Allergies: No Known Allergies  No prescriptions prior to admission.    Review of Systems  Constitutional: Negative.   All other systems reviewed and are negative.   Height 4\' 11"  (1.499 m), weight 57.6 kg (127 lb). Physical Exam  Nursing note and vitals reviewed. Constitutional: She is oriented to person, place, and time. She appears well-developed and well-nourished.  HENT:  Head: Normocephalic and atraumatic.  Neck: Neck supple. No thyromegaly present.  Cardiovascular: Normal rate and regular rhythm.    Respiratory: Effort normal and breath sounds normal.  GI: Soft.  Genitourinary: Vagina normal and uterus normal.  Musculoskeletal: Normal range of motion.  Neurological: She is alert and oriented to person, place, and time. She has normal reflexes.  Skin: Skin is warm and dry.  Psychiatric: She has a normal mood and affect.    No results found for this or any previous visit (from the past 24 hour(s)).  No results found.  Assessment/Plan: 12 week IUP AMA Unplanned pregnancy Proceed with D&E. State mandated counseling for elective TOP done 07/03/16. Consent and form signed.  Kaijah Abts J 07/19/2016, 9:06 AM

## 2016-07-19 NOTE — Transfer of Care (Signed)
Immediate Anesthesia Transfer of Care Note  Patient: Jessica Werner  Procedure(s) Performed: Procedure(s) with comments: DILATATION AND EVACUATION WITH ULTRASOUND GUIDANCE (N/A) - confirmed staffing with chassity on 07/09/16.  Patient Location: PACU  Anesthesia Type:General  Level of Consciousness: awake, alert , oriented and patient cooperative  Airway & Oxygen Therapy: Patient Spontanous Breathing and Patient connected to nasal cannula oxygen  Post-op Assessment: Report given to RN and Post -op Vital signs reviewed and stable  Post vital signs: Reviewed and stable  Last Vitals:  Vitals:   07/19/16 1159  BP: 131/81  Pulse: (!) 9  Resp: 16  Temp: 36.7 C    Last Pain:  Vitals:   07/19/16 1159  TempSrc: Oral      Patients Stated Pain Goal: 5 ([phone] [id number])  Complications: No apparent anesthesia complications

## 2016-07-19 NOTE — Anesthesia Procedure Notes (Signed)
Procedure Name: LMA Insertion Date/Time: 07/19/2016 1:08 PM Performed by: Tobin Chad Pre-anesthesia Checklist: Patient identified, Emergency Drugs available, Suction available and Patient being monitored Patient Re-evaluated:Patient Re-evaluated prior to inductionOxygen Delivery Method: Circle system utilized and Simple face mask Preoxygenation: Pre-oxygenation with 100% oxygen Intubation Type: IV induction Ventilation: Mask ventilation without difficulty LMA: LMA inserted LMA Size: 4.0 Grade View: Grade II Tube type: Oral Number of attempts: 1 Dental Injury: Teeth and Oropharynx as per pre-operative assessment

## 2016-07-20 ENCOUNTER — Encounter (HOSPITAL_COMMUNITY): Payer: Self-pay | Admitting: Obstetrics and Gynecology

## 2016-07-20 NOTE — Op Note (Signed)
NAME:  Jessica Werner, Jessica Werner                      ACCOUNT NO.:  MEDICAL RECORD NO.:  [id number]  LOCATION:                                 FACILITY:  PHYSICIAN:  Lovenia Kim, M.D.     DATE OF BIRTH:  DATE OF PROCEDURE: DATE OF DISCHARGE:                              OPERATIVE REPORT   PREOPERATIVE DIAGNOSES:  Late 1st trimester abortion, advanced maternal age with desire for termination of pregnancy due to concern regarding fetal anomalies.  POSTOPERATIVE DIAGNOSES:  Late 1st trimester abortion, advanced maternal age with desire for termination of pregnancy due to concern regarding fetal anomalies.  PROCEDURE PERFORMED:  Suction, dilatation, and evacuation under ultrasound guidance.  SURGEON:  Lovenia Kim, M.D.  ASSISTANT:  None.  ANESTHESIA:  Local general.  ESTIMATED BLOOD LOSS:  Less than 50 mL.  COMPLICATIONS:  None.  DRAINS:  None.  COUNTS:  Correct.  SPECIMEN:  Products of conception to pathology.  DISPOSITION:  The patient to recovery in good condition.  BRIEF OPERATIVE NOTE:  After being apprised of risks of anesthesia, infection, bleeding, injury to surrounding organs, possible need for repair, delayed versus immediate complications to include bowel and bladder injury, possible need for repair, the patient was brought to the operating room where she was administered general anesthetic without complications.  Prepped and draped in usual sterile fashion. Catheterized until the bladder was empty.  Exam under anesthesia reveals a 12-to 14-week size uterus.  No adnexal masses on ultrasound.  Cervix easily dilated up to a #37 Pratt dilator, a 12-mm suction curette placed.  Aspiration of products of conception was done without difficulty under ultrasound guidance.  A blunt curettage and repeat suction confirms cavity to be empty.  Minimal blood loss noted.  Ultrasound pictures were taken confirming an empty endometrial cavity.  All instruments were  removed from the vagina.  The patient tolerated the procedure well, was awakened, transferred to recovery in good condition after placement of 20 mL of dilute Marcaine paracervical block in standard fashion.     Lovenia Kim, M.D.     RJT/MEDQ  D:  07/19/2016  T:  07/20/2016  Job:  [id number]

## 2016-08-02 DIAGNOSIS — Z1321 Encounter for screening for nutritional disorder: Secondary | ICD-10-CM | POA: Diagnosis not present

## 2016-08-02 DIAGNOSIS — Z130 Encounter for screening for diseases of the blood and blood-forming organs and certain disorders involving the immune mechanism: Secondary | ICD-10-CM | POA: Diagnosis not present

## 2016-08-02 DIAGNOSIS — Z0000 Encounter for general adult medical examination without abnormal findings: Secondary | ICD-10-CM | POA: Diagnosis not present

## 2016-08-02 DIAGNOSIS — K219 Gastro-esophageal reflux disease without esophagitis: Secondary | ICD-10-CM | POA: Diagnosis not present

## 2016-08-02 DIAGNOSIS — E781 Pure hyperglyceridemia: Secondary | ICD-10-CM | POA: Diagnosis not present

## 2016-08-02 DIAGNOSIS — Z131 Encounter for screening for diabetes mellitus: Secondary | ICD-10-CM | POA: Diagnosis not present

## 2016-08-31 DIAGNOSIS — N926 Irregular menstruation, unspecified: Secondary | ICD-10-CM | POA: Diagnosis not present

## 2016-08-31 DIAGNOSIS — Z130 Encounter for screening for diseases of the blood and blood-forming organs and certain disorders involving the immune mechanism: Secondary | ICD-10-CM | POA: Diagnosis not present

## 2016-08-31 DIAGNOSIS — R946 Abnormal results of thyroid function studies: Secondary | ICD-10-CM | POA: Diagnosis not present

## 2016-11-23 ENCOUNTER — Encounter: Payer: Self-pay | Admitting: Cardiology

## 2016-11-23 DIAGNOSIS — R55 Syncope and collapse: Secondary | ICD-10-CM | POA: Diagnosis not present

## 2016-11-23 NOTE — Progress Notes (Signed)
Jessica Werner    Date of visit:  11/23/2016 DOB:  August 22, 1980    Age:  36 yrs. Medical record number:  [id number]     Account number:  [id number] Primary Care Provider: Angelina Pih ____________________________ CURRENT DIAGNOSES  1. Syncope and collapse  2. Hyperlipidemia ____________________________ ALLERGIES  No Known Allergies ____________________________ MEDICATIONS  1. Sprintec (28) 0.25 mg-35 mcg tablet, 1 p.o. daily ____________________________ HISTORY OF PRESENT ILLNESS Patient is seen again for evaluation of recurrent syncope. I saw her about a year and a half ago which time she was having was sent like fairly typical vasovagal syncope. At that time I advised her to do fluid loading and she has done quite well over the past year and a half without recurrent syncope or issues. She came in today because of an episode of recurrent syncope occurring recently. She described the episode as having gotten very little sleep the night before and then got up and drank some fluid ate a banana and went to the airport. While in the airport she developed some substernal burning type pain and felt as if she was having indigestion. She then got up to board the plane and felt hot and felt as if she would pass out and laid on the ground and elevated her feet. By this time the paramedics came and by the time they got there she was feeling much better and tried to board the plane but again felt weak and had to slump in the assistant. She was able however to make her plane flight and has had some recurrent episodes where she would get dizzy and feel as if she was about to faint. She has been emotional and upset about that. Her mother has been in town the past month and she has been restricting salt in her diet. No anginal type pain with exertion but does have occasional dizziness still. While in the office today she had an episode of lightheadedness we'll just as she was leaving and her blood pressure was 100/80  when checked by the nurse but was later 120/80 by me. She has been using antibiotics for a boil in her leg and also is on the second day of her period. She has also noted more frequent bleeding since she has seen me and is now taking birth control pills.SHe did mention having had an abortion in January and evidently had sme bleeding after that.  ____________________________ PAST HISTORY  Past Medical Illnesses:  hyperlipidemia, syncope, polycystic ovary syndrome;  Cardiovascular Illnesses:  vasovaagal syncope;  Surgical Procedures:  cesarean section, ovarian cyst;  NYHA Classification:  I;  Canadian Angina Classification:  Class 0: Asymptomatic;  Cardiology Procedures-Invasive:  no history of prior cardiac procedures;  Cardiology Procedures-Noninvasive:  echocardiogram March 2017;  LVEF of 60% documented via echocardiogram on 09/05/2015,   ____________________________ CARDIO-PULMONARY TEST DATES EKG Date:  11/23/2016;  Echocardiography Date: 09/05/2015;   ____________________________ FAMILY HISTORY Brother -- Brother alive and well Father -- Father alive and well Mother -- Mother alive with problem, Hypertension ____________________________ SOCIAL HISTORY Alcohol Use:  no alcohol use;  Smoking:  never smoked;  Diet:  vegetarian;  Lifestyle:  married;  Exercise:  teaches dance;  Occupation:  teaches dance;  Residence:  lives with husband and children;   ____________________________ REVIEW OF SYSTEMS General:  weight loss of approximately 10 lbs  Integumentary:no rashes or new skin lesions. Eyes: denies diplopia, history of glaucoma or visual problems. Respiratory: denies dyspnea, cough, wheezing or hemoptysis. Cardiovascular:  please review  HPI Abdominal: denies dyspepsia, GI bleeding, constipation, or diarrheaGenitourinary-Female: no dysuria, urgency, frequency, UTIs, or stress incontinence Musculoskeletal:  denies arthritis, venous insufficiency, or muscle weakness Neurological:  dizziness  Psychiatric:  denies depression or anxiety  ____________________________ PHYSICAL EXAMINATION VITAL SIGNS  Blood Pressure:  120/80 Standing, Left arm, regular cuff  , 118/80 Standing, Left arm and regular cuff   Pulse:  80/min. Weight:  123.00 lbs. Height:  59.00"BMI: 25  Constitutional:  pleasant [place] female, in no acute distress Skin:  senile keratoses, facial hair present, hirsutism Head:  normocephalic, normal hair pattern, no masses or tenderness Eyes:  EOMS Intact, PERRLA, C and S clear, Funduscopic exam not done. ENT:  ears, nose and throat reveal no gross abnormalities.  Dentition good. Neck:  supple, without massess. No JVD, thyromegaly or carotid bruits. Carotid upstroke normal. Chest:  normal symmetry, clear to auscultation. Cardiac:  regular rhythm, normal S1 and S2, No S3 or S4, no murmurs, gallops or rubs detected. Peripheral Pulses:  the femoral,dorsalis pedis, and posterior tibial pulses are full and equal bilaterally with no bruits auscultated. Extremities & Back:  no deformities, clubbing, cyanosis, erythema or edema observed. Normal muscle strength and tone. Neurological:  no gross motor or sensory deficits noted, affect appropriate, oriented x3. ____________________________ IMPRESSIONS/PLAN  1. 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 Blood pressure taken on several occasions by me was 120/80 sitting and standing. She had a recurrent episode in the office not associated with arrhythmia and her 12-lead EKG personally reviewed by me as normal. I recommended the lab as noted below and like for her to have electrophysiology referral for a tilt table. Also would like them to weigh in on any other potential reasons for syncope. In the meantime I asked her to start drinking Gatorade in the morning and to not limit  her sodium at all. She is to wear a cardiac event monitor and I will see her back in followup in one month.  Greater than 40 minutes spent with patient and husband with greater than 50% of time face to face.  ____________________________ TODAYS ORDERS  1. Comprehensive Metabolic Panel: Today  2. TSH: Today  3. Complete Blood Count: Today  4. 12 Lead EKG: Today  5. [place]: Today  6.  Consult EP: ASAP for eval of syncope and consideration of tilt table  7. Return Visit: 1 month                       ____________________________ Cardiology Physician:  Kerry Hough MD [facility]

## 2016-11-25 DIAGNOSIS — R55 Syncope and collapse: Secondary | ICD-10-CM | POA: Diagnosis not present

## 2016-11-27 DIAGNOSIS — R55 Syncope and collapse: Secondary | ICD-10-CM | POA: Diagnosis not present

## 2016-12-25 DIAGNOSIS — E785 Hyperlipidemia, unspecified: Secondary | ICD-10-CM | POA: Diagnosis not present

## 2016-12-25 DIAGNOSIS — R55 Syncope and collapse: Secondary | ICD-10-CM | POA: Diagnosis not present

## 2017-01-07 ENCOUNTER — Ambulatory Visit (INDEPENDENT_AMBULATORY_CARE_PROVIDER_SITE_OTHER): Payer: 59 | Admitting: Internal Medicine

## 2017-01-07 ENCOUNTER — Encounter: Payer: Self-pay | Admitting: Internal Medicine

## 2017-01-07 VITALS — BP 120/84 | HR 89 | Ht 59.0 in | Wt 123.4 lb

## 2017-01-07 DIAGNOSIS — R55 Syncope and collapse: Secondary | ICD-10-CM

## 2017-01-07 NOTE — Patient Instructions (Signed)
Medication Instructions:  Your physician recommends that you continue on your current medications as directed. Please refer to the Current Medication list given to you today.  Labwork: None ordered.  Testing/Procedures: None ordered.  Follow-Up: Your physician recommends that you schedule a follow-up appointment in as needed with Dr. Taylor.    Any Other Special Instructions Will Be Listed Below (If Applicable).   If you need a refill on your cardiac medications before your next appointment, please call your pharmacy.   

## 2017-01-07 NOTE — Progress Notes (Signed)
HPI Jessica Werner is referred today by Dr. Wynonia Lawman for evaluation of syncope. She is otherwise healthy. She notes that her first episode of syncope occurred while she was in the car and had just eaten. She experienced some chest pain prior to the episode but had no nausea or diaphoresis. She has had a total of 5 episodes. All the others have occurred when she was standing. Last week she experienced some chest pain and felt like she would pass out and lay down and her spell passed. Her husband notes that her spells last a couple of minutes. No loss of bowel or bladder continence. No tongue biting. She has had no arrhythmias with cardiac monitoring and she has normal LV function by echo.   No Known Allergies   Current Outpatient Prescriptions  Medication Sig Dispense Refill  . acetaminophen (TYLENOL) 325 MG tablet Take 650 mg by mouth daily as needed for moderate pain or headache.    . ibuprofen (ADVIL,MOTRIN) 200 MG tablet Take 200 mg by mouth daily as needed for headache or moderate pain.    Marland Kitchen SPRINTEC 28 0.25-35 MG-MCG tablet Take 1 tablet by mouth daily.  0  . traMADol (ULTRAM) 50 MG tablet Take 1-2 tablets (50-100 mg total) by mouth every 6 (six) hours as needed. 20 tablet 0   No current facility-administered medications for this visit.      Past Medical History:  Diagnosis Date  . Gestational diabetes 2009  . Heartburn    occasional  . Hyperlipidemia     ROS:   All systems reviewed and negative except as noted in the HPI.   Past Surgical History:  Procedure Laterality Date  . CESAREAN SECTION  2009   [facility]  . DIAGNOSTIC LAPAROSCOPY  2008   RIGHT OVARION CYST  . DILATION AND EVACUATION N/A 07/19/2016   Procedure: DILATATION AND EVACUATION WITH ULTRASOUND GUIDANCE;  Surgeon: Brien Few, MD;  Location: [facility];  Service: Gynecology;  Laterality: N/A;  confirmed staffing with chassity on 07/09/16.  . ROBOTIC ASSISTED LAPAROSCOPIC OVARIAN CYSTECTOMY Left 08/19/2014   Procedure: ROBOTIC ASSISTED LAPAROSCOPIC OVARIAN CYSTECTOMY;  Surgeon: Lovenia Kim, MD;  Location: [facility];  Service: Gynecology;  Laterality: Left;     Family History  Problem Relation Age of Onset  . Hypertension Mother      Social History   Social History  . Marital status: Married    Spouse name: N/A  . Number of children: N/A  . Years of education: N/A   Occupational History  . Not on file.   Social History Main Topics  . Smoking status: Never Smoker  . Smokeless tobacco: Never Used  . Alcohol use No  . Drug use: No  . Sexual activity: Yes    Birth control/ protection: None   Other Topics Concern  . Not on file   Social History Narrative  . No narrative on file     BP 120/84   Pulse 89   Ht 4\' 11"  (1.499 m)   Wt 123 lb 6.4 oz (56 kg)   SpO2 99%   BMI 24.92 kg/m   Physical Exam:  Well appearing NAD HEENT: Unremarkable Neck:  No JVD, no thyromegally Lymphatics:  No adenopathy Back:  No CVA tenderness Lungs:  Clear with no wheezes, rales, or rhonchi HEART:  Regular rate rhythm, no murmurs, no rubs, no clicks Abd:  soft, positive bowel sounds, no organomegally, no rebound, no guarding Ext:  2 plus pulses, no edema,  no cyanosis, no clubbing Skin:  No rashes no nodules Neuro:  CN II through XII intact, motor grossly intact  EKG - nsr   Assess/Plan: 1. Syncope - while her symptoms are not classic for vasomotor syncope, she almost certainly has this diagnosis. I have recommended she avoid caffeine, ETOH and have asked her to increase her salt and water intake. If she continues to have trouble, I have asked her to return and we will consider adding florinef or midodrine. 2. Chest pain - her symptoms are non-anginal. I have discussed the mechanism of her chest pain which is almost certainly esophageal spasm. She notes that certain foods will bring them on.   Mikle Bosworth.D.

## 2017-01-10 ENCOUNTER — Institutional Professional Consult (permissible substitution): Payer: BLUE CROSS/BLUE SHIELD | Admitting: Internal Medicine

## 2017-01-10 NOTE — Addendum Note (Signed)
Addendum  created 01/10/17 1348 by Montez Hageman, MD   Sign clinical note

## 2017-01-10 NOTE — Anesthesia Postprocedure Evaluation (Signed)
Anesthesia Post Note  Patient: Jessica Werner  Procedure(s) Performed: Procedure(s) (LRB): DILATATION AND EVACUATION WITH ULTRASOUND GUIDANCE (N/A)     Anesthesia Post Evaluation  Last Vitals:  Vitals:   07/19/16 1442 07/19/16 1505  BP:  121/76  Pulse: 81 80  Resp: 17 20  Temp: 36.9 C     Last Pain:  Vitals:   07/19/16 1159  TempSrc: Oral                 Montez Hageman

## 2017-03-01 DIAGNOSIS — Z01419 Encounter for gynecological examination (general) (routine) without abnormal findings: Secondary | ICD-10-CM | POA: Diagnosis not present

## 2017-04-02 DIAGNOSIS — Z23 Encounter for immunization: Secondary | ICD-10-CM | POA: Diagnosis not present

## 2017-05-27 DIAGNOSIS — H903 Sensorineural hearing loss, bilateral: Secondary | ICD-10-CM | POA: Diagnosis not present

## 2017-06-04 DIAGNOSIS — J069 Acute upper respiratory infection, unspecified: Secondary | ICD-10-CM | POA: Diagnosis not present

## 2017-08-15 DIAGNOSIS — Z131 Encounter for screening for diabetes mellitus: Secondary | ICD-10-CM | POA: Diagnosis not present

## 2017-08-15 DIAGNOSIS — Z0000 Encounter for general adult medical examination without abnormal findings: Secondary | ICD-10-CM | POA: Diagnosis not present

## 2017-08-15 DIAGNOSIS — Z20828 Contact with and (suspected) exposure to other viral communicable diseases: Secondary | ICD-10-CM | POA: Diagnosis not present

## 2017-08-15 DIAGNOSIS — E559 Vitamin D deficiency, unspecified: Secondary | ICD-10-CM | POA: Diagnosis not present

## 2017-08-15 DIAGNOSIS — E282 Polycystic ovarian syndrome: Secondary | ICD-10-CM | POA: Diagnosis not present

## 2017-08-15 DIAGNOSIS — E781 Pure hyperglyceridemia: Secondary | ICD-10-CM | POA: Diagnosis not present

## 2017-08-21 DIAGNOSIS — S61011A Laceration without foreign body of right thumb without damage to nail, initial encounter: Secondary | ICD-10-CM | POA: Diagnosis not present

## 2018-03-04 DIAGNOSIS — Z01419 Encounter for gynecological examination (general) (routine) without abnormal findings: Secondary | ICD-10-CM | POA: Diagnosis not present

## 2018-03-04 DIAGNOSIS — Z6825 Body mass index (BMI) 25.0-25.9, adult: Secondary | ICD-10-CM | POA: Diagnosis not present

## 2018-09-04 DIAGNOSIS — Z0000 Encounter for general adult medical examination without abnormal findings: Secondary | ICD-10-CM | POA: Diagnosis not present

## 2018-09-04 DIAGNOSIS — E781 Pure hyperglyceridemia: Secondary | ICD-10-CM | POA: Diagnosis not present

## 2021-08-03 ENCOUNTER — Other Ambulatory Visit: Payer: Self-pay | Admitting: *Deleted

## 2021-08-03 ENCOUNTER — Telehealth: Payer: Self-pay | Admitting: Hematology and Oncology

## 2021-08-03 DIAGNOSIS — D509 Iron deficiency anemia, unspecified: Secondary | ICD-10-CM

## 2021-08-03 NOTE — Progress Notes (Signed)
New Hematology appt scheduled and lab orders placed for labs to be drawn two days prior

## 2021-08-03 NOTE — Telephone Encounter (Signed)
Scheduled appt per 2/2 referral. Pt is aware of appt date and time. Pt is aware to arrive 15 mins prior to appt.

## 2021-08-07 ENCOUNTER — Other Ambulatory Visit: Payer: Self-pay

## 2021-08-07 ENCOUNTER — Inpatient Hospital Stay: Payer: 59 | Attending: Hematology and Oncology | Admitting: Hematology and Oncology

## 2021-08-07 ENCOUNTER — Inpatient Hospital Stay: Payer: 59

## 2021-08-07 ENCOUNTER — Encounter: Payer: Self-pay | Admitting: Hematology and Oncology

## 2021-08-07 DIAGNOSIS — D500 Iron deficiency anemia secondary to blood loss (chronic): Secondary | ICD-10-CM

## 2021-08-07 DIAGNOSIS — Z79899 Other long term (current) drug therapy: Secondary | ICD-10-CM | POA: Insufficient documentation

## 2021-08-07 DIAGNOSIS — D509 Iron deficiency anemia, unspecified: Secondary | ICD-10-CM | POA: Diagnosis present

## 2021-08-07 NOTE — Progress Notes (Signed)
[place] NOTE  Patient Care Team: Pa, [place] as PCP - General (Family Medicine)  CHIEF COMPLAINTS/PURPOSE OF CONSULTATION:  [name], initial consultation to consider IV iron.  ASSESSMENT & PLAN:   This is a very pleasant 41 year old female patient with no significant past medical history except gestational diabetes, recently has been having some miscellaneous symptoms since COVID-19 infection, found to have severe iron deficiency referred to hematology for additional recommendations.  We have discussed the following details about iron deficiency anemia.  Iron deficiency anemia affects a large proportion of the wellness population especially females of childbearing age and children.  Common causes of iron deficiency include blood loss, reduced iron absorption because of previous surgeries, dietary restrictions or other malabsorption issues, medications that reduce gastric acidity are due to inherited disorders such as IRIDA due to TMPRSS6 mutation. Symptoms of iron deficiency usually include fatigue, pica, restless legs, exercise intolerance, exertional dyspnea, headaches and weakness. Diagnosis can be usually made by history, physical examination, CBC and iron studies.  We generally treat iron deficiency anemia with oral supplements if this can be tolerated.  IV iron is appropriate for patients are unable to tolerate due to gastrointestinal side effects or for patients with severe/ongoing blood loss, history of gastric bypass which reduces gastric acid and henceforth will impair intestinal absorption of oral iron, malabsorption syndromes are occasional in pregnancy. Since she is having ongoing gastrointestinal symptoms and has been feeling very fatigued, we have discussed about considering intravenous iron.  In the future when her gastritis resolves completely, we could attempt the oral iron for replenishment.  There are several formularies of  intravenous iron available in the market.  We have discussed about risk of allergic/infusion reactions including potentially life-threatening anaphylaxis with intravenous iron however these serious allergic reactions are exceedingly rare and overestimated.  In contrast to serious allergic reactions, IV iron may be associated with nonallergic infusion reactions including self-limited urticaria, palpitations, dizziness, neck and back spasm which again occur in less than 1% of the individuals and do not progress to more serious reactions.  We have discussed about Venofer administration, preferred by her insurance.  She will complete 4 infusions of Venofer and return to clinic in 3 months.  She should continue to follow-up with her other doctors for ongoing medical issues.  Benay Pike MD   HISTORY OF PRESENTING ILLNESS:  Jessica Werner 41 y.o. female is here because of [name]  This is a very pleasant 41 year old female patient with heartburn, gestational diabetes referred to hematology for evaluation of iron deficiency anemia.  Patient is here by herself for the initial visit.  According to her, she had COVID-19 back in end of December and since then has had several health issues including ongoing fatigue, intermittent heartburn requiring proton pump inhibitors, some chest pain which has been relieved since she started taking proton pump inhibitors as well as insomnia.  She recently went to her PCP for further evaluation and had some labs which showed anemia hemoglobin of 11.6 g/dL however was found to have severe iron deficiency.  Since she has ongoing heartburn issues, she was sent to hematology for consideration of intravenous iron since she may not be able to tolerate oral iron. She currently continues to feel very tired, has no energy some days and struggles to sleep.  She is using as needed trazodone for sleep.  She also has heavy menstruation, these last for 5 days, 3 out of 5 days are heavy and she has  to wake up through the night to change pads.  She is a vegetarian.  She has never needed iron supplementation in the past.  She is gravida 1 para 1, her child is 5 years old.  She denies any change in bowel habits, hematochezia or melena and she is currently also following up with gastroenterology for recommendations.  No family history of colon cancer. Rest of the pertinent 10 point ROS reviewed and negative.  MEDICAL HISTORY:  Past Medical History:  Diagnosis Date   Gestational diabetes 2009   Heartburn    occasional   Hyperlipidemia     SURGICAL HISTORY: Past Surgical History:  Procedure Laterality Date   CESAREAN SECTION  2009   [facility]   DIAGNOSTIC LAPAROSCOPY  2008   RIGHT OVARION CYST   DILATION AND EVACUATION N/A 07/19/2016   Procedure: DILATATION AND EVACUATION WITH ULTRASOUND GUIDANCE;  Surgeon: Brien Few, MD;  Location: [facility];  Service: Gynecology;  Laterality: N/A;  confirmed staffing with [facility] on 07/09/16.   ROBOTIC ASSISTED LAPAROSCOPIC OVARIAN CYSTECTOMY Left 08/19/2014   Procedure: ROBOTIC ASSISTED LAPAROSCOPIC OVARIAN CYSTECTOMY;  Surgeon: Lovenia Kim, MD;  Location: [facility];  Service: Gynecology;  Laterality: Left;    SOCIAL HISTORY: Social History   Socioeconomic History   Marital status: Married    Spouse name: Not on file   Number of children: Not on file   Years of education: Not on file   Highest education level: Not on file  Occupational History   Not on file  Tobacco Use   Smoking status: Never   Smokeless tobacco: Never  Substance and Sexual Activity   Alcohol use: No   Drug use: No   Sexual activity: Yes    Birth control/protection: None  Other Topics Concern   Not on file  Social History Narrative   Not on file   Social Determinants of Health   Financial Resource Strain: Not on file  Food Insecurity: Not on file  Transportation Needs: Not on file  Physical Activity: Not on file  Stress: Not on file  Social Connections: Not on  file  Intimate Partner Violence: Not on file    FAMILY HISTORY: Family History  Problem Relation Age of Onset   Hypertension Mother     ALLERGIES:  has No Known Allergies.  MEDICATIONS:  Current Outpatient Medications  Medication Sig Dispense Refill   pantoprazole (PROTONIX) 40 MG tablet Take 40 mg by mouth daily.     traZODone (DESYREL) 50 MG tablet Take 50 mg by mouth at bedtime.     SPRINTEC 28 0.25-35 MG-MCG tablet Take 1 tablet by mouth daily.  0   No current facility-administered medications for this visit.   PHYSICAL EXAMINATION: ECOG PERFORMANCE STATUS: 0 - Asymptomatic  Vitals:   08/07/21 0841  BP: (!) 147/87  Pulse: (!) 109  Resp: 18  Temp: 98.1 F (36.7 C)  SpO2: 100%   Filed Weights   08/07/21 0841  Weight: 131 lb 3.2 oz (59.5 kg)    GENERAL:alert, no distress and comfortable SKIN: skin color, texture, turgor are normal, no rashes or significant lesions EYES: normal, conjunctiva are pink and non-injected, sclera clear OROPHARYNX:no exudate, no erythema and lips, buccal mucosa, and tongue normal  NECK: supple, no thyromegaly LYMPH:  no palpable lymphadenopathy in the cervical, axillary LUNGS: clear to auscultation and percussion with normal breathing effort HEART: regular rate & rhythm and no murmurs and no lower extremity edema ABDOMEN:abdomen soft, non-tender and normal bowel sounds.  No  hepatosplenomegaly Musculoskeletal:no cyanosis of digits and no clubbing  PSYCH: alert & oriented x 3 with fluent speech NEURO: no focal motor/sensory deficits  LABORATORY DATA:  I have reviewed the data as listed Lab Results  Component Value Date   WBC 10.2 07/19/2016   HGB 13.2 07/19/2016   HCT 37.3 07/19/2016   MCV 83.1 07/19/2016   PLT 192 07/19/2016     Chemistry      Component Value Date/Time   NA 142 12/12/2014 0125   K 3.9 12/12/2014 0125   CL 106 12/12/2014 0125   CO2 21 06/22/2008 1115   BUN 6 12/12/2014 0125   CREATININE 0.70 12/12/2014 0125       Component Value Date/Time   CALCIUM 9.3 06/22/2008 1115   ALKPHOS 234 (H) 06/22/2008 1115   AST 22 06/22/2008 1115   ALT 14 06/22/2008 1115   BILITOT 0.5 06/22/2008 1115     Have reviewed her labs.  White blood cell count of 8.6, hemoglobin of 11.6, MCV of 78.0, MCH of 24.2, Red cell distribution width high at 18.5, platelet count is normal.  There is no absolute basophilia.  Iron panel and ferritin showed TIBC of 668, transferrin of 477, iron saturation of 3% and serum iron of 21 ferritin level was low at 5.9.  RADIOGRAPHIC STUDIES: I have personally reviewed the radiological images as listed and agreed with the findings in the report. No results found.  All questions were answered. The patient knows to call the clinic with any problems, questions or concerns. I spent 45 minutes in the care of this patient including H and P, review of records, counseling and coordination of care.     Benay Pike, MD 08/07/2021 9:12 AM

## 2021-08-11 MED FILL — Iron Sucrose Inj 20 MG/ML (Fe Equiv): INTRAVENOUS | Qty: 10 | Status: AC

## 2021-08-12 ENCOUNTER — Inpatient Hospital Stay: Payer: 59

## 2021-08-12 ENCOUNTER — Other Ambulatory Visit: Payer: Self-pay

## 2021-08-12 VITALS — BP 126/88 | HR 88 | Temp 98.9°F | Resp 16

## 2021-08-12 DIAGNOSIS — D500 Iron deficiency anemia secondary to blood loss (chronic): Secondary | ICD-10-CM

## 2021-08-12 DIAGNOSIS — D509 Iron deficiency anemia, unspecified: Secondary | ICD-10-CM | POA: Diagnosis not present

## 2021-08-12 MED ORDER — SODIUM CHLORIDE 0.9 % IV SOLN: Freq: Once | INTRAVENOUS | Status: AC

## 2021-08-12 MED ORDER — SODIUM CHLORIDE 0.9 % IV SOLN
200.0000 mg | Freq: Once | INTRAVENOUS | Status: AC
  Administered 2021-08-12: 200 mg via INTRAVENOUS
  Filled 2021-08-12: qty 200

## 2021-08-12 NOTE — Progress Notes (Signed)
Patient tolerated 1st IV iron infusion well. Monitored for 30 minute post observation period without incident. VSS, ambulatory to lobby.

## 2021-08-12 NOTE — Patient Instructions (Signed)

## 2021-08-16 ENCOUNTER — Other Ambulatory Visit: Payer: Self-pay

## 2021-08-16 ENCOUNTER — Inpatient Hospital Stay: Payer: 59

## 2021-08-16 VITALS — BP 120/86 | HR 94 | Temp 98.2°F | Resp 18

## 2021-08-16 DIAGNOSIS — D509 Iron deficiency anemia, unspecified: Secondary | ICD-10-CM | POA: Diagnosis not present

## 2021-08-16 DIAGNOSIS — D500 Iron deficiency anemia secondary to blood loss (chronic): Secondary | ICD-10-CM

## 2021-08-16 MED ORDER — SODIUM CHLORIDE 0.9 % IV SOLN: Freq: Once | INTRAVENOUS | Status: AC

## 2021-08-16 MED ORDER — SODIUM CHLORIDE 0.9 % IV SOLN
200.0000 mg | Freq: Once | INTRAVENOUS | Status: AC
  Administered 2021-08-16: 200 mg via INTRAVENOUS
  Filled 2021-08-16: qty 200

## 2021-08-16 NOTE — Patient Instructions (Signed)

## 2021-08-19 ENCOUNTER — Inpatient Hospital Stay: Payer: 59

## 2021-08-19 ENCOUNTER — Other Ambulatory Visit: Payer: Self-pay

## 2021-08-19 VITALS — BP 125/91 | HR 95 | Temp 98.7°F | Resp 18

## 2021-08-19 DIAGNOSIS — D509 Iron deficiency anemia, unspecified: Secondary | ICD-10-CM | POA: Diagnosis not present

## 2021-08-19 DIAGNOSIS — D500 Iron deficiency anemia secondary to blood loss (chronic): Secondary | ICD-10-CM

## 2021-08-19 MED ORDER — SODIUM CHLORIDE 0.9 % IV SOLN
200.0000 mg | Freq: Once | INTRAVENOUS | Status: AC
  Administered 2021-08-19: 200 mg via INTRAVENOUS
  Filled 2021-08-19: qty 200

## 2021-08-19 MED ORDER — SODIUM CHLORIDE 0.9 % IV SOLN: Freq: Once | INTRAVENOUS | Status: AC

## 2021-08-19 NOTE — Patient Instructions (Signed)

## 2021-08-19 NOTE — Progress Notes (Signed)
Pt stayed for 30 min. Observation. Discharged in stable condition to home without complaints.

## 2021-08-22 ENCOUNTER — Encounter: Payer: 59 | Admitting: Nurse Practitioner

## 2021-08-23 ENCOUNTER — Other Ambulatory Visit: Payer: Self-pay

## 2021-08-23 ENCOUNTER — Inpatient Hospital Stay: Payer: 59

## 2021-08-23 VITALS — BP 131/89 | HR 91 | Temp 98.5°F | Resp 16

## 2021-08-23 DIAGNOSIS — D500 Iron deficiency anemia secondary to blood loss (chronic): Secondary | ICD-10-CM

## 2021-08-23 DIAGNOSIS — D509 Iron deficiency anemia, unspecified: Secondary | ICD-10-CM | POA: Diagnosis not present

## 2021-08-23 MED ORDER — SODIUM CHLORIDE 0.9 % IV SOLN
200.0000 mg | Freq: Once | INTRAVENOUS | Status: AC
  Administered 2021-08-23: 200 mg via INTRAVENOUS
  Filled 2021-08-23: qty 200

## 2021-08-23 MED ORDER — SODIUM CHLORIDE 0.9 % IV SOLN: Freq: Once | INTRAVENOUS | Status: AC

## 2021-08-23 NOTE — Patient Instructions (Signed)

## 2021-08-23 NOTE — Progress Notes (Signed)
Patient was observed for 30 minutes post iron infusion with no adverse reaction. Vitals stable and patient in no distress upon leaving infusion room.

## 2021-08-28 ENCOUNTER — Encounter: Payer: 59 | Admitting: Hematology and Oncology

## 2021-08-28 ENCOUNTER — Other Ambulatory Visit: Payer: 59

## 2021-09-22 ENCOUNTER — Ambulatory Visit: Payer: 59 | Admitting: Internal Medicine

## 2021-10-24 ENCOUNTER — Ambulatory Visit: Payer: 59 | Admitting: Internal Medicine

## 2021-11-08 ENCOUNTER — Encounter: Payer: Self-pay | Admitting: Hematology and Oncology

## 2021-11-08 ENCOUNTER — Inpatient Hospital Stay: Payer: 59

## 2021-11-08 ENCOUNTER — Other Ambulatory Visit: Payer: Self-pay

## 2021-11-08 ENCOUNTER — Inpatient Hospital Stay: Payer: 59 | Attending: Hematology and Oncology | Admitting: Hematology and Oncology

## 2021-11-08 VITALS — BP 134/90 | HR 80 | Temp 97.6°F | Resp 16 | Ht 59.0 in | Wt 132.4 lb

## 2021-11-08 DIAGNOSIS — D500 Iron deficiency anemia secondary to blood loss (chronic): Secondary | ICD-10-CM

## 2021-11-08 DIAGNOSIS — Z79899 Other long term (current) drug therapy: Secondary | ICD-10-CM | POA: Insufficient documentation

## 2021-11-08 DIAGNOSIS — N920 Excessive and frequent menstruation with regular cycle: Secondary | ICD-10-CM | POA: Insufficient documentation

## 2021-11-08 DIAGNOSIS — D509 Iron deficiency anemia, unspecified: Secondary | ICD-10-CM | POA: Diagnosis present

## 2021-11-08 LAB — IRON AND IRON BINDING CAPACITY (CC-WL,HP ONLY)
Iron: 44 ug/dL (ref 28–170)
Saturation Ratios: 9 % — ABNORMAL LOW (ref 10.4–31.8)
TIBC: 479 ug/dL — ABNORMAL HIGH (ref 250–450)
UIBC: 435 ug/dL

## 2021-11-08 LAB — CBC WITH DIFFERENTIAL/PLATELET
Abs Immature Granulocytes: 0.01 10*3/uL (ref 0.00–0.07)
Basophils Absolute: 0 10*3/uL (ref 0.0–0.1)
Basophils Relative: 1 %
Eosinophils Absolute: 0.2 10*3/uL (ref 0.0–0.5)
Eosinophils Relative: 3 %
HCT: 38.2 % (ref 36.0–46.0)
Hemoglobin: 13 g/dL (ref 12.0–15.0)
Immature Granulocytes: 0 %
Lymphocytes Relative: 42 %
Lymphs Abs: 2.7 10*3/uL (ref 0.7–4.0)
MCH: 28.9 pg (ref 26.0–34.0)
MCHC: 34 g/dL (ref 30.0–36.0)
MCV: 84.9 fL (ref 80.0–100.0)
Monocytes Absolute: 0.5 10*3/uL (ref 0.1–1.0)
Monocytes Relative: 7 %
Neutro Abs: 3.1 10*3/uL (ref 1.7–7.7)
Neutrophils Relative %: 47 %
Platelets: 276 10*3/uL (ref 150–400)
RBC: 4.5 MIL/uL (ref 3.87–5.11)
RDW: 14.3 % (ref 11.5–15.5)
WBC: 6.4 10*3/uL (ref 4.0–10.5)
nRBC: 0 % (ref 0.0–0.2)

## 2021-11-08 NOTE — Progress Notes (Signed)
Jessica Werner ?CONSULT NOTE ? ?Patient Care Team: ?Pa, [place] as PCP - General (Family Medicine) ? ?CHIEF COMPLAINTS/PURPOSE OF CONSULTATION:  ?[name], follow-up after IV iron ? ?ASSESSMENT & PLAN:  ? ?This is a very pleasant 41 year old female patient with no significant past medical history except gestational diabetes, had some miscellaneous symptoms since COVID-19 infection, found to have severe iron deficiency referred to hematology for additional recommendations.  We have discussed the following details about iron deficiency anemia.  We have recommended intravenous iron infusion and she is here for follow-up.  She tolerated IV iron very well.  Since her last visit, she has just noticed remarkable improvement in her symptoms.  She has been sleeping well, energy has been back to normal.  She is has been doing some yoga 3 times a week and this has been helping her.  She has heavy menstrual cycles but regular and is already on birth control.  She overall feels back to normal.  She was questioning if she should take oral iron supplementation on a regular basis to keep her iron stores full.  We will repeat CBC, iron panel and ferritin today and discuss about supplementation recommendations. ? ?She can follow-up with [place] as needed at this point.  She can get iron panel and ferritin checked with her annual labs. ? ?Benay Pike MD ? ? ?HISTORY OF PRESENTING ILLNESS:  ?Jessica Werner 41 y.o. female is here because of [name] ? ?This is a very pleasant 41 year old female patient with heartburn, gestational diabetes referred to hematology for evaluation of iron deficiency anemia.   ? ?Since last visit, she has been feeling remarkably well.  Her fatigue has significantly improved.  She is sleeping better and has not been needing any trazodone supplementation.  She has regular menstrual cycles and is on birth control.  She is a vegetarian and is currently not on iron supplementation but is  willing to go back on oral iron if needed.  Rest of the pertinent 10 point ROS reviewed and negative ? ?MEDICAL HISTORY:  ?Past Medical History:  ?Diagnosis Date  ? Gestational diabetes 2009  ? Heartburn   ? occasional  ? Hyperlipidemia   ? ? ?SURGICAL HISTORY: ?Past Surgical History:  ?Procedure Laterality Date  ? CESAREAN SECTION  2009  ? [place] [facility]  ? DIAGNOSTIC LAPAROSCOPY  2008  ? RIGHT OVARION CYST  ? DILATION AND EVACUATION N/A 07/19/2016  ? Procedure: DILATATION AND EVACUATION WITH ULTRASOUND GUIDANCE;  Surgeon: Brien Few, MD;  Location: [facility];  Service: Gynecology;  Laterality: N/A;  confirmed staffing with [facility] on 07/09/16.  ? ROBOTIC ASSISTED LAPAROSCOPIC OVARIAN CYSTECTOMY Left 08/19/2014  ? Procedure: ROBOTIC ASSISTED LAPAROSCOPIC OVARIAN CYSTECTOMY;  Surgeon: Lovenia Kim, MD;  Location: [facility];  Service: Gynecology;  Laterality: Left;  ? ? ?SOCIAL HISTORY: ?Social History  ? ?Socioeconomic History  ? Marital status: Married  ?  Spouse name: Not on file  ? Number of children: Not on file  ? Years of education: Not on file  ? Highest education level: Not on file  ?Occupational History  ? Not on file  ?Tobacco Use  ? Smoking status: Never  ? Smokeless tobacco: Never  ?Substance and Sexual Activity  ? Alcohol use: No  ? Drug use: No  ? Sexual activity: Yes  ?  Birth control/protection: None  ?Other Topics Concern  ? Not on file  ?Social History Narrative  ? Not on file  ? ?Social Determinants of Health  ? ?  Financial Resource Strain: Not on file  ?Food Insecurity: Not on file  ?Transportation Needs: Not on file  ?Physical Activity: Not on file  ?Stress: Not on file  ?Social Connections: Not on file  ?Intimate Partner Violence: Not on file  ? ? ?FAMILY HISTORY: ?Family History  ?Problem Relation Age of Onset  ? Hypertension Mother   ? ? ?ALLERGIES:  has No Known Allergies. ? ?MEDICATIONS:  ?Current Outpatient Medications  ?Medication Sig Dispense Refill  ? pantoprazole (PROTONIX) 40 MG tablet Take 40 mg  by mouth daily.    ? SPRINTEC 28 0.25-35 MG-MCG tablet Take 1 tablet by mouth daily.  0  ? traZODone (DESYREL) 50 MG tablet Take 50 mg by mouth at bedtime.    ? ?No current facility-administered medications for this visit.  ? ?PHYSICAL EXAMINATION: ?ECOG PERFORMANCE STATUS: 0 - Asymptomatic ? ?Vitals:  ? 11/08/21 1522  ?BP: 134/90  ?Pulse: 80  ?Resp: 16  ?Temp: 97.6 ?F (36.4 ?C)  ?SpO2: 100%  ? ?Filed Weights  ? 11/08/21 1522  ?Weight: 132 lb 6.4 oz (60.1 kg)  ? ? ?Physical exam deferred today in lieu of counseling ? ?LABORATORY DATA:  ?I have reviewed the data as listed ?Lab Results  ?Component Value Date  ? WBC 6.4 11/08/2021  ? HGB 13.0 11/08/2021  ? HCT 38.2 11/08/2021  ? MCV 84.9 11/08/2021  ? PLT 276 11/08/2021  ? ?  Chemistry   ?   ?Component Value Date/Time  ? NA 142 12/12/2014 0125  ? K 3.9 12/12/2014 0125  ? CL 106 12/12/2014 0125  ? CO2 21 06/22/2008 1115  ? BUN 6 12/12/2014 0125  ? CREATININE 0.70 12/12/2014 0125  ?    ?Component Value Date/Time  ? CALCIUM 9.3 06/22/2008 1115  ? ALKPHOS 234 (H) 06/22/2008 1115  ? AST 22 06/22/2008 1115  ? ALT 14 06/22/2008 1115  ? BILITOT 0.5 06/22/2008 1115  ?  ? ?CBC, iron panel and ferritin pending ? ?RADIOGRAPHIC STUDIES: ?I have personally reviewed the radiological images as listed and agreed with the findings in the report. ?No results found. ? ?All questions were answered. The patient knows to call the clinic with any problems, questions or concerns. ?I spent 68mnutes in the care of this patient including H and P, review of records, counseling and coordination of care. ? ?  ? PBenay Pike MD ?11/08/2021 4:27 PM ? ? ? ?

## 2021-11-09 LAB — FERRITIN: Ferritin: 76 ng/mL (ref 11–307)
